# Patient Record
Sex: Female | Born: 1975 | Hispanic: No | Marital: Married | Smoking: Never smoker
Health system: Southern US, Community
[De-identification: ages and names within clinical notes are randomized; demographics above are authoritative.]

## PROBLEM LIST (undated history)

## (undated) DIAGNOSIS — Z789 Other specified health status: Secondary | ICD-10-CM

## (undated) HISTORY — DX: Other specified health status: Z78.9

## (undated) HISTORY — PX: NO PAST SURGERIES: SHX2092

---

## 2008-12-07 ENCOUNTER — Ambulatory Visit (HOSPITAL_COMMUNITY): Admission: RE | Admit: 2008-12-07 | Discharge: 2008-12-07 | Payer: Self-pay | Admitting: Obstetrics & Gynecology

## 2009-01-08 ENCOUNTER — Encounter: Admission: RE | Admit: 2009-01-08 | Discharge: 2009-01-08 | Payer: Self-pay | Admitting: Pulmonary Disease

## 2009-04-19 ENCOUNTER — Ambulatory Visit: Payer: Self-pay | Admitting: Obstetrics and Gynecology

## 2009-04-20 ENCOUNTER — Ambulatory Visit: Payer: Self-pay | Admitting: Family

## 2009-04-20 ENCOUNTER — Inpatient Hospital Stay (HOSPITAL_COMMUNITY): Admission: AD | Admit: 2009-04-20 | Discharge: 2009-04-22 | Payer: Self-pay | Admitting: Obstetrics & Gynecology

## 2010-05-27 ENCOUNTER — Emergency Department (HOSPITAL_COMMUNITY)
Admission: EM | Admit: 2010-05-27 | Discharge: 2010-05-27 | Payer: Self-pay | Source: Home / Self Care | Admitting: Family Medicine

## 2010-11-27 LAB — POCT URINALYSIS DIPSTICK
Bilirubin Urine: NEGATIVE
Ketones, ur: NEGATIVE mg/dL
Specific Gravity, Urine: >=1.03 (ref 1.005–1.030)
pH: 6 (ref 5.0–8.0)

## 2010-11-27 LAB — URINE CULTURE
Colony Count: 65000
Culture  Setup Time: 201109140534

## 2010-12-20 LAB — CBC
Hemoglobin: 13.2 g/dL (ref 12.0–15.0)
MCHC: 34 g/dL (ref 30.0–36.0)
RDW: 15.6 % — ABNORMAL HIGH (ref 11.5–15.5)

## 2011-04-25 ENCOUNTER — Inpatient Hospital Stay (HOSPITAL_COMMUNITY): Payer: Medicaid Other

## 2011-04-25 ENCOUNTER — Encounter (HOSPITAL_COMMUNITY): Payer: Self-pay

## 2011-04-25 ENCOUNTER — Inpatient Hospital Stay (HOSPITAL_COMMUNITY)
Admission: AD | Admit: 2011-04-25 | Discharge: 2011-04-25 | Disposition: A | Discharge status: Home / Self Care | Payer: Medicaid Other | Source: Ambulatory Visit | Attending: Obstetrics & Gynecology | Admitting: Obstetrics & Gynecology

## 2011-04-25 DIAGNOSIS — O209 Hemorrhage in early pregnancy, unspecified: Secondary | ICD-10-CM | POA: Insufficient documentation

## 2011-04-25 HISTORY — DX: Other specified health status: Z78.9

## 2011-04-25 LAB — CBC
Hemoglobin: 11.2 g/dL — ABNORMAL LOW (ref 12.0–15.0)
MCH: 28.3 pg (ref 26.0–34.0)
RBC: 3.96 MIL/uL (ref 3.87–5.11)

## 2011-04-25 LAB — WET PREP, GENITAL
Clue Cells Wet Prep HPF POC: NONE SEEN
Trich, Wet Prep: NONE SEEN
Yeast Wet Prep HPF POC: NONE SEEN

## 2011-04-25 LAB — URINALYSIS, ROUTINE W REFLEX MICROSCOPIC
Glucose, UA: NEGATIVE mg/dL
Ketones, ur: 15 mg/dL — AB
Leukocytes, UA: NEGATIVE
Protein, ur: NEGATIVE mg/dL

## 2011-04-25 LAB — URINE MICROSCOPIC-ADD ON

## 2011-04-25 LAB — ANTIBODY SCREEN: Antibody Screen: NEGATIVE

## 2011-04-25 LAB — RPR: RPR: NONREACTIVE

## 2011-04-25 LAB — ABO/RH: ABO/RH(D): B POS

## 2011-04-25 NOTE — ED Provider Notes (Signed)
History     CSN: [id number] Arrival date & time: 04/25/2011  4:42 PM  Chief Complaint  Patient presents with  . Vaginal Bleeding  . Abdominal Pain   HPI Jacqueline [name] is a 35 y.o. female @ [redacted] weeks gestation who presents to [facility]U with vaginal bleeding. The bleeding started approximately three hours prior to arrival to MAU and now is lighter and brown in color. The patient and her husband  provided the history.   Past Medical History  Diagnosis Date  . No pertinent past medical history     Past Surgical History  Procedure Date  . No past surgeries     No family history on file.  History  Substance Use Topics  . Smoking status: Never Smoker   . Smokeless tobacco: Never Used  . Alcohol Use: No    OB History    Grav Para Term Preterm Abortions TAB SAB Ect Mult Living   3 2 2       2       Review of Systems  Constitutional: Negative for fever and chills.  HENT: Negative.   Eyes: Negative.   Gastrointestinal: Positive for abdominal pain.  Genitourinary: Positive for vaginal bleeding and vaginal discharge. Negative for dysuria.  Neurological: Negative for dizziness and headaches.    Physical Exam  BP 114/70  Pulse 78  Temp(Src) 98.3 F (36.8 C) (Oral)  Resp 16  Ht 5' 5.75" (1.67 m)  Wt 143 lb (64.864 kg)  BMI 23.26 kg/m2  LMP 01/28/2011  Physical Exam  Nursing note and vitals reviewed. Constitutional: She is oriented to person, place, and time. She appears well-developed and well-nourished.  Eyes: EOM are normal.  Neck: Neck supple.  Pulmonary/Chest: Effort normal.  Abdominal: Soft. There is no tenderness.  Genitourinary:       There is blood tinged mucous discharge in the vaginal vault. Cervix is closed and no CMT. Uterus is approximately 14 week size.  Musculoskeletal: Normal range of motion.  Neurological: She is alert and oriented to person, place, and time. No cranial nerve deficit.  Skin: Skin is warm and dry.    ED Course  Procedures  US OB Comp  Less 14 Wks   Status: Final result     Study Result     *RADIOLOGY REPORT*   Clinical Data: 35 year old female with bleeding, no prenatal care. Gestational age by LMP 12 weeks and 3 days.   OBSTETRIC <14 WK ULTRASOUND   Technique:  Transabdominal ultrasound was performed for evaluation of the gestation as well as the maternal uterus and adnexal regions.   Comparison:  None relevant.   Intrauterine gestational sac: Single Yolk sac: Not visible Embryo: Visible Cardiac Activity: Detected Heart Rate: 163 bpm   CRL:  67.8 mm  13w  1d        [place] EDC: 10/30/2011   Maternal uterus/Adnexae: No pelvic free fluid.  The left ovary appears within normal limits measuring 3.4 x 1.1 x 1.6 cm.  The right ovary could not be visualized despite repeated imaging attempts.  No subchorionic hemorrhage.  Suggestion of mildly increased amniotic fluid.   IMPRESSION: 1. Viable singleton intrauterine pregnancy with estimated gestational age of [redacted] weeks and 1 days by crown-rump length. 2. Suggestion of increased amniotic fluid.  Recommend attention on follow-up. 2.  Nonvisualization of the right adnexa.   Original Report Authenticated By: Harley Hallmark, M.D.      Imaging Information Signed by       Signed Date/Time  Phone Pager    Elvera Maria 04/25/2011  7:47 PM EDT [phone]     MDM Results for orders placed during the hospital encounter of 04/25/11 (from the past 24 hour(s))  URINALYSIS, ROUTINE W REFLEX MICROSCOPIC     Status: Abnormal   Collection Time   04/25/11  4:55 PM      Component Value Range   Color, Urine YELLOW  YELLOW    Appearance CLEAR  CLEAR    Specific Gravity, Urine 1.025  1.005 - 1.030    pH 5.5  5.0 - 8.0    Glucose, UA NEGATIVE  NEGATIVE (mg/dL)   Hgb urine dipstick MODERATE (*) NEGATIVE    Bilirubin Urine NEGATIVE  NEGATIVE    Ketones, ur 15 (*) NEGATIVE (mg/dL)   Protein, ur NEGATIVE  NEGATIVE (mg/dL)   Urobilinogen, UA 0.2  0.0 - 1.0 (mg/dL)     Nitrite NEGATIVE  NEGATIVE    Leukocytes, UA NEGATIVE  NEGATIVE   URINE MICROSCOPIC-ADD ON     Status: Abnormal   Collection Time   04/25/11  4:55 PM      Component Value Range   Squamous Epithelial / LPF FEW (*) RARE    WBC, UA 3-6  <3 (WBC/hpf)   RBC / HPF 0-2  <3 (RBC/hpf)   Bacteria, UA FEW (*) RARE   POCT PREGNANCY, URINE     Status: Normal   Collection Time   04/25/11  5:26 PM      Component Value Range   Preg Test, Ur POSITIVE    WET PREP, GENITAL     Status: Abnormal   Collection Time   04/25/11  6:00 PM      Component Value Range   Yeast, Wet Prep NONE SEEN  NONE SEEN    Trich, Wet Prep NONE SEEN  NONE SEEN    Clue Cells, Wet Prep NONE SEEN  NONE SEEN    WBC, Wet Prep HPF POC MODERATE (*) NONE SEEN   CBC     Status: Abnormal   Collection Time   04/25/11  6:05 PM      Component Value Range   WBC 7.6  4.0 - 10.5 (K/uL)   RBC 3.96  3.87 - 5.11 (MIL/uL)   Hemoglobin 11.2 (*) 12.0 - 15.0 (g/dL)   HCT 62.9 (*) 52.8 - 46.0 (%)   MCV 82.8  78.0 - 100.0 (fL)   MCH 28.3  26.0 - 34.0 (pg)   MCHC 34.1  30.0 - 36.0 (g/dL)   RDW 41.3  24.4 - 01.0 (%)   Platelets 230  150 - 400 (K/uL)  ABO/RH     Status: Normal   Collection Time   04/25/11  6:05 PM      Component Value Range   ABO/RH(D) B POS     Assessment: Viable IUP at 13.[redacted] weeks gestation                       Bleeding in second trimester pregnancy  Plan:  Start prenatal care           Return here as needed.           Pregnancy verification letter       Kerrie Buffalo, NP 04/25/11 1955

## 2011-04-25 NOTE — Progress Notes (Signed)
Onset of brown mucus discharge with a little bit of cramping which started about 3 hours ago, 12 weeks

## 2011-04-29 NOTE — ED Provider Notes (Signed)
Agree with above note.  Jacqueline [name] 04/29/2011 9:16 AM   

## 2011-06-02 LAB — RUBELLA ANTIBODY, IGM: Rubella: IMMUNE

## 2011-06-03 ENCOUNTER — Other Ambulatory Visit: Payer: Self-pay | Admitting: Obstetrics & Gynecology

## 2011-06-03 DIAGNOSIS — O09529 Supervision of elderly multigravida, unspecified trimester: Secondary | ICD-10-CM

## 2011-06-11 ENCOUNTER — Other Ambulatory Visit: Payer: Self-pay | Admitting: Obstetrics

## 2011-06-11 DIAGNOSIS — O09529 Supervision of elderly multigravida, unspecified trimester: Secondary | ICD-10-CM

## 2011-06-12 ENCOUNTER — Ambulatory Visit (HOSPITAL_COMMUNITY): Payer: Medicaid Other

## 2011-06-12 ENCOUNTER — Ambulatory Visit (HOSPITAL_COMMUNITY)
Admission: RE | Admit: 2011-06-12 | Discharge: 2011-06-12 | Disposition: A | Discharge status: Home / Self Care | Payer: Medicaid Other | Source: Ambulatory Visit | Attending: Obstetrics | Admitting: Obstetrics

## 2011-06-12 DIAGNOSIS — O09529 Supervision of elderly multigravida, unspecified trimester: Secondary | ICD-10-CM

## 2011-06-12 DIAGNOSIS — O358XX0 Maternal care for other (suspected) fetal abnormality and damage, not applicable or unspecified: Secondary | ICD-10-CM | POA: Insufficient documentation

## 2011-06-12 DIAGNOSIS — Z1389 Encounter for screening for other disorder: Secondary | ICD-10-CM | POA: Insufficient documentation

## 2011-06-12 DIAGNOSIS — Z363 Encounter for antenatal screening for malformations: Secondary | ICD-10-CM | POA: Insufficient documentation

## 2011-06-16 DIAGNOSIS — O09529 Supervision of elderly multigravida, unspecified trimester: Secondary | ICD-10-CM | POA: Insufficient documentation

## 2011-06-16 DIAGNOSIS — Z843 Family history of consanguinity: Secondary | ICD-10-CM | POA: Insufficient documentation

## 2011-06-16 NOTE — Progress Notes (Signed)
Genetic Counseling  High-Risk Gestation Note  Appointment Date:  06/12/2011 Referred By: Brock Bad, MD Date of Birth:  12/31/1975 Attending: Particia Nearing, MD    Pregnancy History: [id number] Estimated Date of Delivery: 11/04/11 Estimated Gestational Age: [redacted]w[redacted]d  Jacqueline [name] was seen for prenatal genetic counseling given advanced maternal age. The patient was accompanied by a friend as well as her two children.   They were counseled regarding maternal age and the association with risk for chromosome conditions due to nondisjunction with aging of the ova.   We reviewed chromosomes, nondisjunction, and the associated 1 in 35 risk for fetal aneuploidy related to a maternal age of 62 at 55 weeks and 2 days gestation. They were counseled that the risk for aneuploidy decreases as gestational age increases, accounting for those pregnancies which spontaneously abort.  We specifically discussed Down syndrome (trisomy 47), trisomies 60 and 67, and sex chromosome aneuploidies (47,XXX and 47,XXY) including the common features and prognoses of each.    We reviewed available screening and diagnostic options.  Regarding screening tests, we discussed the options of Quad screen and ultrasound.  They understand that screening tests are used to modify a patient's a priori risk for aneuploidy, typically based on age.  This estimate provides a pregnancy specific risk assessment.  We also reviewed the availability of diagnostic option of amniocentesis.  A risk of 1 in 200-300 was given for amniocentesis, the primary complication being spontaneous pregnancy loss. We discussed the risks, limitations, and benefits of each.  They were counseled that 50-80% of fetuses with Down syndrome and up to 90% of fetuses with trisomies 13 and 18, when well visualized, have detectable anomalies or soft markers by ultrasound.  We reviewed the results of today's ultrasound, which confirmed the pregnancy to be 19 weeks and 2 days  gestation. A choroid plexus cyst was visualized at the time of today's visit. The choroid plexus is an area in the brain where cerebral spinal fluid, the fluid that bathes the brain and spinal cord, is made.  Cysts, or fluid filled sacs, are sometimes found in the choroid plexus of babies both before and after they are born.  Approximately 1% of pregnancies evaluated by ultrasound will show these cysts.  The significance of these cysts remains unclear, although it is believed that in many cases they are a normal variation of development.  It appears that there may be a slightly increased risk of a chromosome condition associated with these cysts when they are seen with other ultrasound findings.  The presence of a CPC would increase the risk for Trisomy 18. Thus, the risk for T18 in the current pregnancy would be increased above the patient's age related risk to approximately 1 in 25. After reviewing the available information and options, Jacqueline [name] elected to have ultrasound only and declined Quad screening and amniocentesis.  She expressed that she is not interested in pursuing diagnostic testing at this time or in the future, given the associated risk of complications.  She understands that ultrasound cannot rule out all birth defects or genetic syndromes.   Both family histories were reviewed and found to be contributory for consanguinity.  The patient reported that she and her husband and second cousins. Her paternal grandfather and his maternal grandmother are siblings. We discussed that children born to a consanguineous couple are at increased risk for genetic health problems.  This increase in risk is related to the possibility of passing on recessive genes. We explained that every  person carries approximately 7-10 non-working genes that when received in a double dose results in recessive genetic conditions.  In general, unrelated couples have a relatively low risk of having a child with a recessive  condition because the likelihood of both parents carrying the same non-working recessive gene is very low.  However, when a couple is related, they have inherited some of their genetic information from the same family member, which leads to an increased chance that they may carry the same recessive gene and have a child with a recessive condition.  For second cousin (fifth degree relatives) unions, the risk to have a child with a birth defect, mental retardation, or genetic condition is increased approximately 1% above the general population risk (3-5%).  We reviewed chromosomes, genes, and recessive inheritance in detail.   Variants in hemoglobin and thalassemias are more common in individuals from [place].  We do not have records that Jacqueline [name] has had a hemoglobin electrophoresis. This screen is available to her if desired. Additionally, these conditions are routinely test for as part of the newborn screening panel for babies born in [place] in a hospital. Without further information regarding the provided family history, an accurate genetic risk cannot be calculated.   Further genetic counseling is warranted if more information is obtained.   The patient denied exposure to environmental toxins or chemical agents.  She denied the use of alcohol, tobacco or street drugs.  She denied significant viral illnesses during the course of her pregnancy.  Her medical and surgical history were noncontributory.    A complete obstetrical ultrasound was performed at the time of today's evaluation.  The ultrasound report is reported separately.     We counseled the patient for approximately 30 minutes regarding the above risks and available options.     Clydie Braun Tomoki Lucken, MS, [facility] 06/16/2011

## 2011-09-15 NOTE — L&D Delivery Note (Signed)
Delivery Note At 4:49 AM a viable female was delivered via Vaginal, Spontaneous Delivery (Presentation:LOA ;  ).  APGAR: 9, 9; weight 8 lb 3.2 oz (3720 g).   Placenta status: Intact, Spontaneous.  Cord: 3 vessels with the following complications: None.  Cord pH: none  Anesthesia: Epidural  Episiotomy: None Lacerations: 1st degree;Perineal Suture Repair: 2.0 chromic Est. Blood Loss (mL):   Mom to postpartum.  Baby to nursery-stable.  Jacqueline [name] A 11/02/2011, 6:00 AM

## 2011-09-15 NOTE — L&D Delivery Note (Deleted)
Delivery Note At 4:49 AM [name] viable female was delivered via Vaginal, Spontaneous Delivery (Presentation: ;  ).  APGAR: 9, 9; weight 8 lb 3.2 oz (3720 g).   Placenta status: Intact, Spontaneous.  Cord: 3 vessels with the following complications: None.  Cord pH: none  Anesthesia: Epidural  Episiotomy: None Lacerations: 1st degree;Perineal Suture Repair: 2.0 chromic and 3-0 vicryl rapide. Est. Blood Loss (mL):   Mom to postpartum.  Baby to NICU for prematurity.  Jacqueline [name] 11/02/2011, 5:57 AM

## 2011-11-02 ENCOUNTER — Encounter (HOSPITAL_COMMUNITY): Payer: Self-pay | Admitting: Anesthesiology

## 2011-11-02 ENCOUNTER — Encounter (HOSPITAL_COMMUNITY): Payer: Self-pay | Admitting: *Deleted

## 2011-11-02 ENCOUNTER — Inpatient Hospital Stay (HOSPITAL_COMMUNITY): Payer: Medicaid Other | Admitting: Anesthesiology

## 2011-11-02 ENCOUNTER — Inpatient Hospital Stay (HOSPITAL_COMMUNITY)
Admission: AD | Admit: 2011-11-02 | Discharge: 2011-11-03 | DRG: 775 | Disposition: A | Discharge status: Home / Self Care | Payer: Medicaid Other | Source: Ambulatory Visit | Attending: Obstetrics | Admitting: Obstetrics

## 2011-11-02 DIAGNOSIS — O99892 Other specified diseases and conditions complicating childbirth: Secondary | ICD-10-CM | POA: Diagnosis present

## 2011-11-02 DIAGNOSIS — Z843 Family history of consanguinity: Secondary | ICD-10-CM

## 2011-11-02 DIAGNOSIS — O09529 Supervision of elderly multigravida, unspecified trimester: Secondary | ICD-10-CM | POA: Diagnosis present

## 2011-11-02 DIAGNOSIS — Z22330 Carrier of Group B streptococcus: Secondary | ICD-10-CM

## 2011-11-02 LAB — CBC
HCT: 35.8 % — ABNORMAL LOW (ref 36.0–46.0)
Hemoglobin: 11.8 g/dL — ABNORMAL LOW (ref 12.0–15.0)
MCH: 27.5 pg (ref 26.0–34.0)
MCHC: 33 g/dL (ref 30.0–36.0)
MCV: 83.4 fL (ref 78.0–100.0)
RDW: 14 % (ref 11.5–15.5)

## 2011-11-02 MED ORDER — OXYTOCIN 10 UNIT/ML IJ SOLN
40.0000 [IU] | Freq: Once | INTRAVENOUS | Status: AC
  Administered 2011-11-02: 40 [IU] via INTRAVENOUS
  Filled 2011-11-02: qty 4

## 2011-11-02 MED ORDER — FENTANYL 2.5 MCG/ML BUPIVACAINE 1/10 % EPIDURAL INFUSION (WH - ANES)
14.0000 mL/h | INTRAMUSCULAR | Status: DC
  Administered 2011-11-02: 14 mL/h via EPIDURAL
  Filled 2011-11-02: qty 60

## 2011-11-02 MED ORDER — LACTATED RINGERS IV SOLN: 500.0000 mL | Freq: Once | INTRAVENOUS | Status: DC

## 2011-11-02 MED ORDER — ZOLPIDEM TARTRATE 5 MG PO TABS
5.0000 mg | ORAL_TABLET | Freq: Every evening | ORAL | Status: DC | PRN
Start: 2011-11-02 — End: 2011-11-02

## 2011-11-02 MED ORDER — PHENYLEPHRINE 40 MCG/ML (10ML) SYRINGE FOR IV PUSH (FOR BLOOD PRESSURE SUPPORT)
80.0000 ug | PREFILLED_SYRINGE | INTRAVENOUS | Status: DC | PRN
  Filled 2011-11-02: qty 5
  Filled 2011-11-02: qty 2

## 2011-11-02 MED ORDER — ZOLPIDEM TARTRATE 5 MG PO TABS: 5.0000 mg | ORAL_TABLET | Freq: Every evening | ORAL | Status: DC | PRN

## 2011-11-02 MED ORDER — TETANUS-DIPHTH-ACELL PERTUSSIS 5-2.5-18.5 LF-MCG/0.5 IM SUSP
0.5000 mL | Freq: Once | INTRAMUSCULAR | Status: AC
  Administered 2011-11-03: 0.5 mL via INTRAMUSCULAR
  Filled 2011-11-02: qty 0.5

## 2011-11-02 MED ORDER — IBUPROFEN 600 MG PO TABS
600.0000 mg | ORAL_TABLET | Freq: Four times a day (QID) | ORAL | Status: DC
  Administered 2011-11-02 – 2011-11-03 (×6): 600 mg via ORAL
  Filled 2011-11-02 (×5): qty 1

## 2011-11-02 MED ORDER — SIMETHICONE 80 MG PO CHEW: 80.0000 mg | CHEWABLE_TABLET | ORAL | Status: DC | PRN

## 2011-11-02 MED ORDER — OXYTOCIN 20 UNITS IN LACTATED RINGERS INFUSION - SIMPLE
125.0000 mL/h | INTRAVENOUS | Status: DC | PRN
  Administered 2011-11-02: 125 mL/h via INTRAVENOUS
  Filled 2011-11-02: qty 1000

## 2011-11-02 MED ORDER — LIDOCAINE HCL (PF) 1 % IJ SOLN
30.0000 mL | INTRAMUSCULAR | Status: DC | PRN
  Filled 2011-11-02 (×2): qty 30

## 2011-11-02 MED ORDER — FLEET ENEMA 7-19 GM/118ML RE ENEM: 1.0000 | ENEMA | RECTAL | Status: DC | PRN

## 2011-11-02 MED ORDER — ONDANSETRON HCL 4 MG/2ML IJ SOLN: 4.0000 mg | INTRAMUSCULAR | Status: DC | PRN

## 2011-11-02 MED ORDER — OXYTOCIN 20 UNITS IN LACTATED RINGERS INFUSION - SIMPLE: 125.0000 mL/h | INTRAVENOUS | Status: DC | PRN

## 2011-11-02 MED ORDER — METHYLERGONOVINE MALEATE 0.2 MG/ML IJ SOLN: 0.2000 mg | INTRAMUSCULAR | Status: DC | PRN

## 2011-11-02 MED ORDER — LANOLIN HYDROUS EX OINT: TOPICAL_OINTMENT | CUTANEOUS | Status: DC | PRN

## 2011-11-02 MED ORDER — DIBUCAINE 1 % RE OINT: 1.0000 "application " | TOPICAL_OINTMENT | RECTAL | Status: DC | PRN

## 2011-11-02 MED ORDER — CITRIC ACID-SODIUM CITRATE 334-500 MG/5ML PO SOLN: 30.0000 mL | ORAL | Status: DC | PRN

## 2011-11-02 MED ORDER — OXYCODONE-ACETAMINOPHEN 5-325 MG PO TABS: 1.0000 | ORAL_TABLET | ORAL | Status: DC | PRN

## 2011-11-02 MED ORDER — DIPHENHYDRAMINE HCL 25 MG PO CAPS: 25.0000 mg | ORAL_CAPSULE | Freq: Four times a day (QID) | ORAL | Status: DC | PRN

## 2011-11-02 MED ORDER — DIPHENHYDRAMINE HCL 50 MG/ML IJ SOLN: 12.5000 mg | INTRAMUSCULAR | Status: DC | PRN

## 2011-11-02 MED ORDER — ONDANSETRON HCL 4 MG PO TABS: 4.0000 mg | ORAL_TABLET | ORAL | Status: DC | PRN

## 2011-11-02 MED ORDER — OXYTOCIN 20 UNITS IN LACTATED RINGERS INFUSION - SIMPLE: 40.0000 m[IU]/min | INTRAVENOUS | Status: DC

## 2011-11-02 MED ORDER — PHENYLEPHRINE 40 MCG/ML (10ML) SYRINGE FOR IV PUSH (FOR BLOOD PRESSURE SUPPORT)
80.0000 ug | PREFILLED_SYRINGE | INTRAVENOUS | Status: DC | PRN
  Filled 2011-11-02: qty 2

## 2011-11-02 MED ORDER — BENZOCAINE-MENTHOL 20-0.5 % EX AERO: 1.0000 "application " | INHALATION_SPRAY | CUTANEOUS | Status: DC | PRN

## 2011-11-02 MED ORDER — LACTATED RINGERS IV SOLN: 500.0000 mL | INTRAVENOUS | Status: DC | PRN

## 2011-11-02 MED ORDER — METHYLERGONOVINE MALEATE 0.2 MG PO TABS: 0.2000 mg | ORAL_TABLET | ORAL | Status: DC | PRN

## 2011-11-02 MED ORDER — ACETAMINOPHEN 325 MG PO TABS: 650.0000 mg | ORAL_TABLET | ORAL | Status: DC | PRN

## 2011-11-02 MED ORDER — WITCH HAZEL-GLYCERIN EX PADS: 1.0000 "application " | MEDICATED_PAD | CUTANEOUS | Status: DC | PRN

## 2011-11-02 MED ORDER — OXYTOCIN 20 UNITS IN LACTATED RINGERS INFUSION - SIMPLE: 125.0000 mL/h | Freq: Once | INTRAVENOUS | Status: DC

## 2011-11-02 MED ORDER — IBUPROFEN 600 MG PO TABS: 600.0000 mg | ORAL_TABLET | Freq: Four times a day (QID) | ORAL | Status: DC

## 2011-11-02 MED ORDER — BENZOCAINE-MENTHOL 20-0.5 % EX AERO
1.0000 "application " | INHALATION_SPRAY | CUTANEOUS | Status: DC | PRN
  Administered 2011-11-03: 1 via TOPICAL

## 2011-11-02 MED ORDER — SENNOSIDES-DOCUSATE SODIUM 8.6-50 MG PO TABS
2.0000 | ORAL_TABLET | Freq: Every day | ORAL | Status: DC
  Administered 2011-11-02: 2 via ORAL

## 2011-11-02 MED ORDER — LACTATED RINGERS IV SOLN
INTRAVENOUS | Status: DC
  Administered 2011-11-02: 03:00:00 via INTRAVENOUS

## 2011-11-02 MED ORDER — TETANUS-DIPHTH-ACELL PERTUSSIS 5-2.5-18.5 LF-MCG/0.5 IM SUSP: 0.5000 mL | Freq: Once | INTRAMUSCULAR | Status: DC

## 2011-11-02 MED ORDER — PRENATAL MULTIVITAMIN CH
1.0000 | ORAL_TABLET | Freq: Every day | ORAL | Status: DC
  Administered 2011-11-02 – 2011-11-03 (×2): 1 via ORAL
  Filled 2011-11-02 (×2): qty 1

## 2011-11-02 MED ORDER — IBUPROFEN 600 MG PO TABS
600.0000 mg | ORAL_TABLET | Freq: Four times a day (QID) | ORAL | Status: DC | PRN
  Filled 2011-11-02: qty 1

## 2011-11-02 MED ORDER — OXYTOCIN BOLUS FROM INFUSION
500.0000 mL | Freq: Once | INTRAVENOUS | Status: DC
  Filled 2011-11-02: qty 1000
  Filled 2011-11-02: qty 500

## 2011-11-02 MED ORDER — PRENATAL MULTIVITAMIN CH: 1.0000 | ORAL_TABLET | Freq: Every day | ORAL | Status: DC

## 2011-11-02 MED ORDER — ONDANSETRON HCL 4 MG/2ML IJ SOLN: 4.0000 mg | Freq: Four times a day (QID) | INTRAMUSCULAR | Status: DC | PRN

## 2011-11-02 MED ORDER — SENNOSIDES-DOCUSATE SODIUM 8.6-50 MG PO TABS: 2.0000 | ORAL_TABLET | Freq: Every day | ORAL | Status: DC

## 2011-11-02 MED ORDER — LIDOCAINE HCL (PF) 1 % IJ SOLN
INTRAMUSCULAR | Status: DC | PRN
  Administered 2011-11-02 (×2): 5 mL

## 2011-11-02 MED ORDER — EPHEDRINE 5 MG/ML INJ
10.0000 mg | INTRAVENOUS | Status: DC | PRN
  Filled 2011-11-02: qty 4
  Filled 2011-11-02: qty 2

## 2011-11-02 MED ORDER — SODIUM CHLORIDE 0.9 % IV SOLN
2.0000 g | Freq: Four times a day (QID) | INTRAVENOUS | Status: DC
  Administered 2011-11-02: 2 g via INTRAVENOUS
  Filled 2011-11-02 (×4): qty 2000

## 2011-11-02 MED ORDER — EPHEDRINE 5 MG/ML INJ
10.0000 mg | INTRAVENOUS | Status: DC | PRN
  Filled 2011-11-02: qty 2

## 2011-11-02 NOTE — Anesthesia Procedure Notes (Signed)
Epidural Patient location during procedure: OB Start time: 11/02/2011 3:31 AM  Staffing Anesthesiologist: Brayton Caves R Performed by: anesthesiologist   Preanesthetic Checklist Completed: patient identified, site marked, surgical consent, pre-op evaluation, timeout performed, IV checked, risks and benefits discussed and monitors and equipment checked  Epidural Patient position: sitting Prep: site prepped and draped and DuraPrep Patient monitoring: continuous pulse ox and blood pressure Approach: midline Injection technique: LOR air and LOR saline  Needle:  Needle type: Tuohy  Needle gauge: 17 G Needle length: 9 cm Needle insertion depth: 5 cm cm Catheter type: closed end flexible Catheter size: 19 Gauge Catheter at skin depth: 10 cm Test dose: negative  Assessment Events: blood not aspirated, injection not painful, no injection resistance, negative IV test and no paresthesia  Additional Notes Patient identified.  Risk benefits discussed including failed block, incomplete pain control, headache, nerve damage, paralysis, blood pressure changes, nausea, vomiting, reactions to medication both toxic or allergic, and postpartum back pain.  Patient expressed understanding and wished to proceed.  All questions were answered.  Sterile technique used throughout procedure and epidural site dressed with sterile barrier dressing. No paresthesia or other complications noted.The patient did not experience any signs of intravascular injection such as tinnitus or metallic taste in mouth nor signs of intrathecal spread such as rapid motor block. Please see nursing notes for vital signs.

## 2011-11-02 NOTE — Anesthesia Preprocedure Evaluation (Signed)

## 2011-11-02 NOTE — Anesthesia Postprocedure Evaluation (Signed)
  Anesthesia Post-op Note  Patient: Jacqueline [name]  Procedure(s) Performed: * No procedures listed *  Patient Location: PACU and Mother/Baby  Anesthesia Type: Epidural  Level of Consciousness: awake, alert , oriented and patient cooperative  Airway and Oxygen Therapy: Patient Spontanous Breathing  Post-op Pain: none  Post-op Assessment: Post-op Vital signs reviewed  Post-op Vital Signs: Reviewed and stable  Complications: No apparent anesthesia complications 

## 2011-11-02 NOTE — H&P (Signed)
Jacqueline [name] is a 36 y.o. female presenting for UC's. Maternal Medical History:  Reason for admission: Reason for admission: contractions.  Contractions: Onset was 3-5 hours ago.   Frequency: regular.   Duration is approximately 1 minute.    Fetal activity: Perceived fetal activity is normal.   Last perceived fetal movement was within the past hour.    Prenatal complications: no prenatal complications Prenatal Complications - Diabetes: none.    OB History    Grav Para Term Preterm Abortions TAB SAB Ect Mult Living   3 3 3       3      Past Medical History  Diagnosis Date  . No pertinent past medical history    Past Surgical History  Procedure Date  . No past surgeries    Family History: family history is not on file. Social History:  reports that she has never smoked. She has never used smokeless tobacco. She reports that she does not drink alcohol or use illicit drugs.  Review of Systems  All other systems reviewed and are negative.    Dilation: 10 Effacement (%): 100 Station: +2 Exam by:: AEarlene Plater, RN Blood pressure 108/68, pulse 74, temperature 97.9 F (36.6 C), temperature source Oral, resp. rate 20, last menstrual period 01/28/2011, SpO2 100.00%, unknown if currently breastfeeding. Maternal Exam:  Uterine Assessment: Contraction strength is firm.  Contraction duration is 1 minute. Abdomen: Patient reports no abdominal tenderness. Fetal presentation: vertex  Introitus: Normal vulva. Normal vagina.    Physical Exam  Nursing note and vitals reviewed. Constitutional: She appears well-developed and well-nourished.  Neck: Normal range of motion.  Cardiovascular: Normal rate and regular rhythm.   Respiratory: Effort normal.  GI: Soft.  Genitourinary: Vagina normal and uterus normal.  Musculoskeletal: Normal range of motion.  Skin: Skin is warm and dry.    Prenatal labs: ABO, Rh: --/--/B POS (08/11 1805) Antibody: Negative (08/11 0000) Rubella: Immune (09/18  0000) RPR: Nonreactive (08/11 0000)  HBsAg: Negative (08/11 0000)  HIV: Non-reactive (08/11 0000)  GBS: Positive (01/03 0000)   Assessment/Plan: Term pregnancy.  Active labor.   Abdoul Encinas A 11/02/2011, 5:52 AM

## 2011-11-02 NOTE — Progress Notes (Signed)
UR chart review completed.  

## 2011-11-02 NOTE — Anesthesia Postprocedure Evaluation (Signed)
  Anesthesia Post-op Note  Patient: Jacqueline [name]  Procedure(s) Performed: * No procedures listed *  Patient Location: PACU and Mother/Baby  Anesthesia Type: Epidural  Level of Consciousness: awake, alert , oriented and patient cooperative  Airway and Oxygen Therapy: Patient Spontanous Breathing  Post-op Pain: none  Post-op Assessment: Post-op Vital signs reviewed  Post-op Vital Signs: Reviewed and stable  Complications: No apparent anesthesia complications

## 2011-11-03 LAB — CBC
Hemoglobin: 10.7 g/dL — ABNORMAL LOW (ref 12.0–15.0)
MCH: 27.9 pg (ref 26.0–34.0)
MCHC: 32.8 g/dL (ref 30.0–36.0)
Platelets: 166 10*3/uL (ref 150–400)
RDW: 14.2 % (ref 11.5–15.5)

## 2011-11-03 MED ORDER — IBUPROFEN 600 MG PO TABS: 600.0000 mg | ORAL_TABLET | Freq: Four times a day (QID) | ORAL | Status: DC

## 2011-11-03 MED ORDER — OXYCODONE-ACETAMINOPHEN 5-325 MG PO TABS: 1.0000 | ORAL_TABLET | ORAL | Status: DC | PRN

## 2011-11-03 MED ORDER — BENZOCAINE-MENTHOL 20-0.5 % EX AERO
INHALATION_SPRAY | CUTANEOUS | Status: AC
  Filled 2011-11-03: qty 56

## 2011-11-03 NOTE — Progress Notes (Signed)
Post Partum Day 1 Subjective: no complaints  Objective: Blood pressure 103/67, pulse 82, temperature 98.3 F (36.8 C), temperature source Oral, resp. rate 18, last menstrual period 01/28/2011, SpO2 98.00%, unknown if currently breastfeeding.  Physical Exam:  General: alert and no distress Lochia: appropriate Uterine Fundus: firm Incision: healing well DVT Evaluation: No evidence of DVT seen on physical exam.   Basename 11/03/11 0540 11/02/11 0300  HGB 10.7* 11.8*  HCT 32.6* 35.8*    Assessment/Plan: Discharge home.   LOS: 1 day   Jaeson Molstad A 11/03/2011, 9:26 AM

## 2011-11-03 NOTE — Discharge Summary (Signed)
Obstetric Discharge Summary Reason for Admission: onset of labor Prenatal Procedures: ultrasound Intrapartum Procedures: spontaneous vaginal delivery Postpartum Procedures: none Complications-Operative and Postpartum: none Hemoglobin  Date Value Range Status  11/03/2011 10.7* 12.0-15.0 (g/dL) Final     HCT  Date Value Range Status  11/03/2011 32.6* 36.0-46.0 (%) Final    Discharge Diagnoses: Term Pregnancy-delivered  Discharge Information: Date: 11/03/2011 Activity: pelvic rest Diet: routine Medications: PNV, Ibuprofen and Percocet Condition: stable Instructions: refer to practice specific booklet Discharge to: home Follow-up Information    Schedule an appointment as soon as possible for a visit with Brock Bad, MD.   Contact information:   [address] [place] [address] [phone]          Newborn Data: Live born female  Birth Weight: 8 lb 3.2 oz (3720 g) APGAR: 9, 9  Home with mother.  Burnice Vassel A 11/03/2011, 9:48 AM

## 2013-08-31 ENCOUNTER — Ambulatory Visit (INDEPENDENT_AMBULATORY_CARE_PROVIDER_SITE_OTHER): Payer: Medicaid Other | Admitting: Obstetrics & Gynecology

## 2013-08-31 ENCOUNTER — Encounter: Payer: Self-pay | Admitting: Obstetrics & Gynecology

## 2013-08-31 VITALS — BP 108/69 | Temp 98.2°F | Ht 63.5 in | Wt 159.0 lb

## 2013-08-31 DIAGNOSIS — Z3201 Encounter for pregnancy test, result positive: Secondary | ICD-10-CM

## 2013-08-31 DIAGNOSIS — Z3482 Encounter for supervision of other normal pregnancy, second trimester: Secondary | ICD-10-CM

## 2013-08-31 DIAGNOSIS — O09521 Supervision of elderly multigravida, first trimester: Secondary | ICD-10-CM

## 2013-08-31 DIAGNOSIS — O09529 Supervision of elderly multigravida, unspecified trimester: Secondary | ICD-10-CM

## 2013-08-31 DIAGNOSIS — Z3480 Encounter for supervision of other normal pregnancy, unspecified trimester: Secondary | ICD-10-CM

## 2013-08-31 LAB — POCT URINALYSIS DIPSTICK
Spec Grav, UA: >=1.03
Urobilinogen, UA: NEGATIVE

## 2013-08-31 LAB — HIV ANTIBODY (ROUTINE TESTING W REFLEX): HIV: NONREACTIVE

## 2013-08-31 NOTE — Patient Instructions (Signed)
Amniocentesis Amniocentesis (amnio) is the removal of a small amount of fluid that surrounds the baby in the amniotic sac. Once the fluid is removed, it may be examined to find answers to a number of serious questions. An amnio is often done early in pregnancy (between 15 and 17 weeks) to determine if there is a complication with the baby, or it is done later in pregnancy (between 39 and 37 weeks) to see if the baby's lungs are mature. Amnios that are done later in pregnancy are often done to help weigh the risks and benefits of a needed early delivery. Amnios done early in the second trimester are commonly referred to as genetic amnios, as they are typically done to check for a potential life-altering genetic abnormality. Rarely, an amnio is done to evaluate the amniotic fluid for concerns of infection. Amniocentesis may be done for other reasons, including:   If the mother is 39 years old or older. This is because of the increased risk of chromosome abnormalities, such as Down's syndrome or various chromosomal trisomies.  To determine any genetic problems.  To look for signs of infection in the uterus.  To determine if the baby's lungs are mature enough for the baby to survive outside of the uterus. This information is important in pregnancies when the baby must be delivered early. LET YOUR CAREGIVER KNOW ABOUT:  Any complications you have had with the pregnancy, such as bleeding or contractions.  Allergies.  Medicines taken, including vitamins, herbs, eyedrops, over-the-counter medicines, and creams.  Use of steroids (by mouth or creams).  Previous problems with numbing medicines.  History of bleeding problems or blood clots.  Previous surgery.  Other health problems. RISKS AND COMPLICATIONS  Complications can include:  Vaginal bleeding.  Transmission of an infection from mother to baby, such as hepatitis C or other viruses.  Leaking of amniotic fluid.  Premature  labor.  Fetal injury.  Injury to the placenta.  Miscarriage (rare). This procedure is done only if your caregiver feels the information obtained from the procedure justifies the risk. Amnios should not be performed before 15 weeks of pregnancy unless it is absolutely necessary. BEFORE THE PROCEDURE   Ask your caregiver any questions you may have.  Eat as usual.  Drink enough fluids to keep your urine clear or pale yellow.  You may want to arrange for someone to drive you home after the procedure. PROCEDURE  A careful ultrasound is done to evaluate the baby for its position and for where the best pockets of fluid lie. The mother's abdomen is prepped with a solution to prevent infections. Often, a sterile ultrasound probe is used to watch the location of the amniocentesis needle being used. A small spot of the skin may be injected with a numbing medicine. In that location, a longer needle is introduced through the skin and down to the level of the baby. Amniotic fluid is removed into a syringe and sent to the lab for evaluation. AFTER THE PROCEDURE   Ask your caregiver if you need a RhoGam shot.  You will rest for 1 to 2 hours for observation.  Your baby will be placed on a monitor for 1 to 2 hours to see if there are any problems.  You may develop cramping and a small amount of vaginal bleeding right after the amnio.  Ask when your test results will be ready. Make sure you get your test results. Document Released: 08/28/2000 Document Revised: 11/23/2011 Document Reviewed: 07/06/2011 [employer] Patient Information  2014 [employer].  

## 2013-08-31 NOTE — Progress Notes (Signed)
Pulse- 79  Subjective:    Jacqueline [name] is being seen today for her first obstetrical visit.  This is not a planned pregnancy. She is at [redacted]w[redacted]d gestation. Her obstetrical history is significant for advanced maternal age. Relationship with FOB: spouse, living together. Patient does intend to breast feed. Pregnancy history fully reviewed.  Menstrual History: OB History   Grav Para Term Preterm Abortions TAB SAB Ect Mult Living   4 3 3       3       Last Pap: 2012 WNL Menarche age: 20  Patient's last menstrual period was 05/30/2013.    The following portions of the patient's history were reviewed and updated as appropriate: allergies, current medications, past family history, past medical history, past social history, past surgical history and problem list.  Review of Systems Pertinent items are noted in HPI.    Objective:      General Appearance:    Alert, cooperative, no distress, appears stated age  Head:    Normocephalic, without obvious abnormality, atraumatic  Eyes:    PERRL, conjunctiva/corneas clear, EOM's intact, fundi    benign, both eyes  Ears:    Normal TM's and external ear canals, both ears  Nose:   Nares normal, septum midline, mucosa normal, no drainage    or sinus tenderness  Throat:   Lips, mucosa, and tongue normal; teeth and gums normal  Neck:   Supple, symmetrical, trachea midline, no adenopathy;    thyroid:  no enlargement/tenderness/nodules; no carotid   bruit or JVD  Back:     Symmetric, no curvature, ROM normal, no CVA tenderness  Lungs:     Clear to auscultation bilaterally, respirations unlabored  Chest Wall:    No tenderness or deformity   Heart:    Regular rate and rhythm, S1 and S2 normal, no murmur, rub   or gallop  Breast Exam:    No tenderness, masses, or nipple abnormality  Abdomen:     Soft, non-tender, bowel sounds active all four quadrants,    no masses, no organomegaly  Genitalia:    Normal female without lesion, discharge or tenderness   Extremities:   Extremities normal, atraumatic, no cyanosis or edema  Pulses:   2+ and symmetric all extremities  Skin:   Skin color, texture, turgor normal, no rashes or lesions  Lymph nodes:   Cervical, supraclavicular, and axillary nodes normal  Neurologic:   CNII-XII intact, normal strength, sensation and reflexes    throughout    Assessment:    Pregnancy at [redacted]w[redacted]d weeks    Plan:    Initial labs drawn. Prenatal vitamins.  Counseling provided regarding continued use of seat belts, cessation of alcohol consumption, smoking or use of illicit drugs; infection precautions i.e., influenza/TDAP immunizations, toxoplasmosis,CMV, parvovirus, listeria and varicella; workplace safety, exercise during pregnancy; routine dental care, safe medications, sexual activity, hot tubs, saunas, pools, travel, caffeine use, fish and methlymercury, potential toxins, hair treatments, varicose veins Weight gain recommendations per IOM guidelines reviewed:  overweight/BMI 25 - 29.9--> gain 15 - 25 lbs Problem list reviewed and updated. NIPT discussed Role of ultrasound in pregnancy discussed Amniocentesis discussed Follow up in 6 weeks. 50% of 20 min visit spent on counseling and coordination of care.

## 2013-09-01 LAB — OBSTETRIC PANEL
Antibody Screen: NEGATIVE
Eosinophils Relative: 1 % (ref 0–5)
HCT: 35.2 % — ABNORMAL LOW (ref 36.0–46.0)
Lymphocytes Relative: 19 % (ref 12–46)
Lymphs Abs: 1.7 10*3/uL (ref 0.7–4.0)
MCV: 79.3 fL (ref 78.0–100.0)
Monocytes Absolute: 0.4 10*3/uL (ref 0.1–1.0)
Monocytes Relative: 4 % (ref 3–12)
RBC: 4.44 MIL/uL (ref 3.87–5.11)
Rh Type: POSITIVE
Rubella: 19.7 Index — ABNORMAL HIGH (ref <0.90)
WBC: 8.8 10*3/uL (ref 4.0–10.5)

## 2013-09-01 LAB — PAP IG, CT-NG, RFX HPV ASCU: GC Probe Amp: NEGATIVE

## 2013-09-01 LAB — VITAMIN D 25 HYDROXY (VIT D DEFICIENCY, FRACTURES): Vit D, 25-Hydroxy: 12 ng/mL — ABNORMAL LOW (ref 30–89)

## 2013-09-01 LAB — CULTURE, OB URINE: Colony Count: NO GROWTH

## 2013-09-04 LAB — HEMOGLOBINOPATHY EVALUATION: Hgb S Quant: 0 %

## 2013-09-08 ENCOUNTER — Encounter: Payer: Self-pay | Admitting: Obstetrics & Gynecology

## 2013-09-08 DIAGNOSIS — E559 Vitamin D deficiency, unspecified: Secondary | ICD-10-CM | POA: Insufficient documentation

## 2013-09-13 ENCOUNTER — Other Ambulatory Visit: Payer: Self-pay | Admitting: *Deleted

## 2013-09-13 DIAGNOSIS — E559 Vitamin D deficiency, unspecified: Secondary | ICD-10-CM

## 2013-09-13 MED ORDER — OB COMPLETE PETITE 35-5-1-200 MG PO CAPS: 1.0000 | ORAL_CAPSULE | Freq: Every day | ORAL | Status: DC

## 2013-09-14 NOTE — L&D Delivery Note (Signed)
Delivery Note At 2:53 PM a viable female was delivered via  (Presentation: OA).    Placenta status: delivered intact via Tomasa BlaseSchultz.    Cord:  with the following complications: none .    Anesthesia: Epidural  Episiotomy: none Lacerations: 1st degree Suture Repair: 3.0 vicryl rapide Est. Blood Loss (mL): 250 ml  Mom to postpartum.  Baby to Couplet care / Skin to Skin.  [name],[name] 02/28/2014, 3:09 PM

## 2013-10-12 ENCOUNTER — Ambulatory Visit (INDEPENDENT_AMBULATORY_CARE_PROVIDER_SITE_OTHER): Payer: Medicaid Other | Admitting: Obstetrics & Gynecology

## 2013-10-12 VITALS — BP 103/67 | Temp 98.4°F | Wt 159.0 lb

## 2013-10-12 DIAGNOSIS — Z3480 Encounter for supervision of other normal pregnancy, unspecified trimester: Secondary | ICD-10-CM

## 2013-10-12 NOTE — Progress Notes (Signed)
Pulse 74 Pt is doing well. No complaints today.

## 2013-10-12 NOTE — Patient Instructions (Signed)
Second Trimester of Pregnancy The second trimester is from week 13 through week 28, months 4 through 6. The second trimester is often a time when you feel your best. Your body has also adjusted to being pregnant, and you begin to feel better physically. Usually, morning sickness has lessened or quit completely, you may have more energy, and you may have an increase in appetite. The second trimester is also a time when the fetus is growing rapidly. At the end of the sixth month, the fetus is about 9 inches long and weighs about 1 pounds. You will likely begin to feel the baby move (quickening) between 18 and 20 weeks of the pregnancy. BODY CHANGES Your body goes through many changes during pregnancy. The changes vary from woman to woman.   Your weight will continue to increase. You will notice your lower abdomen bulging out.  You may begin to get stretch marks on your hips, abdomen, and breasts.  You may develop headaches that can be relieved by medicines approved by your caregiver.  You may urinate more often because the fetus is pressing on your bladder.  You may develop or continue to have heartburn as a result of your pregnancy.  You may develop constipation because certain hormones are causing the muscles that push waste through your intestines to slow down.  You may develop hemorrhoids or swollen, bulging veins (varicose veins).  You may have back pain because of the weight gain and pregnancy hormones relaxing your joints between the bones in your pelvis and as a result of a shift in weight and the muscles that support your balance.  Your breasts will continue to grow and be tender.  Your gums may bleed and may be sensitive to brushing and flossing.  Dark spots or blotches (chloasma, mask of pregnancy) may develop on your face. This will likely fade after the baby is born.  A dark line from your belly button to the pubic area (linea nigra) may appear. This will likely fade after the  baby is born. WHAT TO EXPECT AT YOUR PRENATAL VISITS During a routine prenatal visit:  You will be weighed to make sure you and the fetus are growing normally.  Your blood pressure will be taken.  Your abdomen will be measured to track your baby's growth.  The fetal heartbeat will be listened to.  Any test results from the previous visit will be discussed. Your caregiver may ask you:  How you are feeling.  If you are feeling the baby move.  If you have had any abnormal symptoms, such as leaking fluid, bleeding, severe headaches, or abdominal cramping.  If you have any questions. Other tests that may be performed during your second trimester include:  Blood tests that check for:  Low iron levels (anemia).  Gestational diabetes (between 24 and 28 weeks).  Rh antibodies.  Urine tests to check for infections, diabetes, or protein in the urine.  An ultrasound to confirm the proper growth and development of the baby.  An amniocentesis to check for possible genetic problems.  Fetal screens for spina bifida and Down syndrome. HOME CARE INSTRUCTIONS   Avoid all smoking, herbs, alcohol, and unprescribed drugs. These chemicals affect the formation and growth of the baby.  Follow your caregiver's instructions regarding medicine use. There are medicines that are either safe or unsafe to take during pregnancy.  Exercise only as directed by your caregiver. Experiencing uterine cramps is a good sign to stop exercising.  Continue to eat regular,   healthy meals.  Wear a good support bra for breast tenderness.  Do not use hot tubs, steam rooms, or saunas.  Wear your seat belt at all times when driving.  Avoid raw meat, uncooked cheese, cat litter boxes, and soil used by cats. These carry germs that can cause birth defects in the baby.  Take your prenatal vitamins.  Try taking a stool softener (if your caregiver approves) if you develop constipation. Eat more high-fiber foods,  such as fresh vegetables or fruit and whole grains. Drink plenty of fluids to keep your urine clear or pale yellow.  Take warm sitz baths to soothe any pain or discomfort caused by hemorrhoids. Use hemorrhoid cream if your caregiver approves.  If you develop varicose veins, wear support hose. Elevate your feet for 15 minutes, 3 4 times a day. Limit salt in your diet.  Avoid heavy lifting, wear low heel shoes, and practice good posture.  Rest with your legs elevated if you have leg cramps or low back pain.  Visit your dentist if you have not gone yet during your pregnancy. Use a soft toothbrush to brush your teeth and be gentle when you floss.  A sexual relationship may be continued unless your caregiver directs you otherwise.  Continue to go to all your prenatal visits as directed by your caregiver. SEEK MEDICAL CARE IF:   You have dizziness.  You have mild pelvic cramps, pelvic pressure, or nagging pain in the abdominal area.  You have persistent nausea, vomiting, or diarrhea.  You have a bad smelling vaginal discharge.  You have pain with urination. SEEK IMMEDIATE MEDICAL CARE IF:   You have a fever.  You are leaking fluid from your vagina.  You have spotting or bleeding from your vagina.  You have severe abdominal cramping or pain.  You have rapid weight gain or loss.  You have shortness of breath with chest pain.  You notice sudden or extreme swelling of your face, hands, ankles, feet, or legs.  You have not felt your baby move in over an hour.  You have severe headaches that do not go away with medicine.  You have vision changes. Document Released: 08/25/2001 Document Revised: 05/03/2013 Document Reviewed: 11/01/2012 Exit[employer] Patient Information 2014 [employer].  

## 2013-10-17 ENCOUNTER — Other Ambulatory Visit: Payer: Medicaid Other

## 2013-10-31 ENCOUNTER — Other Ambulatory Visit: Payer: Medicaid Other

## 2013-11-09 ENCOUNTER — Encounter: Payer: Medicaid Other | Admitting: Obstetrics & Gynecology

## 2013-11-14 ENCOUNTER — Emergency Department (HOSPITAL_BASED_OUTPATIENT_CLINIC_OR_DEPARTMENT_OTHER)
Admission: EM | Admit: 2013-11-14 | Discharge: 2013-11-14 | Disposition: A | Discharge status: Home / Self Care | Payer: Medicaid Other | Attending: Emergency Medicine | Admitting: Emergency Medicine

## 2013-11-14 ENCOUNTER — Encounter (HOSPITAL_BASED_OUTPATIENT_CLINIC_OR_DEPARTMENT_OTHER): Payer: Self-pay | Admitting: Emergency Medicine

## 2013-11-14 DIAGNOSIS — J40 Bronchitis, not specified as acute or chronic: Secondary | ICD-10-CM

## 2013-11-14 DIAGNOSIS — J209 Acute bronchitis, unspecified: Secondary | ICD-10-CM | POA: Insufficient documentation

## 2013-11-14 DIAGNOSIS — Z79899 Other long term (current) drug therapy: Secondary | ICD-10-CM | POA: Insufficient documentation

## 2013-11-14 MED ORDER — AZITHROMYCIN 250 MG PO TABS: 250.0000 mg | ORAL_TABLET | Freq: Every day | ORAL | Status: DC

## 2013-11-14 NOTE — Discharge Instructions (Signed)

## 2013-11-14 NOTE — ED Provider Notes (Signed)
CSN: [id number]     Arrival date & time 11/14/13  1903 History   First MD Initiated Contact with Patient 11/14/13 2107     Chief Complaint  Patient presents with  . Cough     (Consider location/radiation/quality/duration/timing/severity/associated sxs/prior Treatment) Patient is a 38 y.o. female presenting with cough. The history is provided by the patient. No language interpreter was used.  Cough Cough characteristics:  Productive Sputum characteristics:  Nondescript Severity:  Moderate Onset quality:  Gradual Duration:  6 days Timing:  Constant Progression:  Worsening Chronicity:  New Smoker: no   Relieved by:  Nothing Worsened by:  Nothing tried Ineffective treatments:  None tried Associated symptoms: fever   Associated symptoms: no chest pain     Past Medical History  Diagnosis Date  . No pertinent past medical history    Past Surgical History  Procedure Laterality Date  . No past surgeries     No family history on file. History  Substance Use Topics  . Smoking status: Never Smoker   . Smokeless tobacco: Never Used  . Alcohol Use: No   OB History   Grav Para Term Preterm Abortions TAB SAB Ect Mult Living   4 3 3       3      Review of Systems  Constitutional: Positive for fever.  Respiratory: Positive for cough.   Cardiovascular: Negative for chest pain.  All other systems reviewed and are negative.      Allergies  Review of patient's allergies indicates no known allergies.  Home Medications   Current Outpatient Rx  Name  Route  Sig  Dispense  Refill  . EXPIRED: ibuprofen (ADVIL,MOTRIN) 600 MG tablet   Oral   Take 1 tablet (600 mg total) by mouth every 6 (six) hours.   30 tablet   5   . EXPIRED: oxyCODONE-acetaminophen (PERCOCET) 5-325 MG per tablet   Oral   Take 1-2 tablets by mouth every 3 (three) hours as needed for pain (moderate - severe pain).   40 tablet   0   . prenatal vitamin w/FE, FA (PRENATAL 1 + 1) 27-1 MG TABS   Oral  Take 1 tablet by mouth daily.            BP 109/59  Pulse 94  Temp(Src) 98.2 F (36.8 C) (Oral)  Resp 18  Ht 5\' 4"  (1.626 m)  Wt 140 lb (63.504 kg)  BMI 24.02 kg/m2  SpO2 100% Physical Exam  Nursing note and vitals reviewed. Constitutional: She is oriented to person, place, and time. She appears well-developed and well-nourished.  HENT:  Head: Normocephalic.  Eyes: Conjunctivae and EOM are normal. Pupils are equal, round, and reactive to light.  Neck: Normal range of motion. Neck supple.  Cardiovascular: Normal rate and regular rhythm.   Pulmonary/Chest: Effort normal and breath sounds normal.  Abdominal: She exhibits no distension.  Musculoskeletal: Normal range of motion.  Neurological: She is alert and oriented to person, place, and time.  Skin: Skin is warm.  Psychiatric: She has a normal mood and affect.    ED Course  Procedures (including critical care time) Labs Review Labs Reviewed - No data to display Imaging Review No results found.   EKG Interpretation None      MDM   Final diagnoses:  Bronchitis    Zithromax.   Follow up with [facility] for recheck in 3-4 days.   Return if symptoms worsen or change    Elson AreasLeslie K Maxmilian Trostel, [place] JerseyPA-C 11/14/13 2212  Lonia Skinner Steinauer, PA-C 11/14/13 2212

## 2013-11-14 NOTE — ED Notes (Signed)
Cough for a week. No fever. Headache, runny nose, sore throat, ear pain.

## 2013-11-14 NOTE — ED Notes (Signed)
RT at bs for assessment

## 2013-11-14 NOTE — ED Provider Notes (Signed)
Medical screening examination/treatment/procedure(s) were performed by non-physician practitioner and as supervising physician I was immediately available for consultation/collaboration.   EKG Interpretation None        Makalyn Lennox, MD 11/14/13 2332 

## 2013-11-15 ENCOUNTER — Ambulatory Visit (INDEPENDENT_AMBULATORY_CARE_PROVIDER_SITE_OTHER): Payer: Medicaid Other | Admitting: Obstetrics & Gynecology

## 2013-11-15 VITALS — BP 102/66 | Temp 98.7°F | Wt 159.0 lb

## 2013-11-15 DIAGNOSIS — Z3480 Encounter for supervision of other normal pregnancy, unspecified trimester: Secondary | ICD-10-CM

## 2013-11-15 LAB — POCT URINALYSIS DIPSTICK
BILIRUBIN UA: NEGATIVE
Blood, UA: NEGATIVE
GLUCOSE UA: NEGATIVE
KETONES UA: NEGATIVE
LEUKOCYTES UA: NEGATIVE
Nitrite, UA: NEGATIVE
Protein, UA: NEGATIVE
Spec Grav, UA: 1.02
Urobilinogen, UA: NEGATIVE
pH, UA: 5

## 2013-11-15 NOTE — Progress Notes (Signed)
Pulse 91 Pt states that she has a cold.  Pt was seen at Urgent care and was given azithromycin 250mg .  Pt has also been taking Robitussin DM with little relief of cough.

## 2013-11-19 NOTE — Patient Instructions (Signed)
Glucose Tolerance Test This is a test to see how your body processes carbohydrates. This test is often done to check patients for diabetes or the possibility of developing it. PREPARATION FOR TEST You should have nothing to eat or drink 12 hours before the test. You will be given a form of sugar (glucose) and then blood samples will be drawn from your vein to determine the level of sugar in your blood. Alternatively, blood may be drawn from your finger for testing. You should not smoke or exercise during the test. NORMAL FINDINGS  Fasting: 70-115 mg/dL  30 minutes: less than 200 mg/dL  1 hour: less than 200 mg/dL  2 hours: less than 140 mg/dL  3 hours: 70-115 mg/dL  4 hours: 70-115 mg/dL Ranges for normal findings may vary among different laboratories and hospitals. You should always check with your doctor after having lab work or other tests done to discuss the meaning of your test results and whether your values are considered within normal limits. MEANING OF TEST Your caregiver will go over the test results with you and discuss the importance and meaning of your results, as well as treatment options and the need for additional tests. OBTAINING THE TEST RESULTS It is your responsibility to obtain your test results. Ask the lab or department performing the test when and how you will get your results. Document Released: 09/23/2004 Document Revised: 11/23/2011 Document Reviewed: 08/11/2008 ExitCare Patient Information 2014 [employer].  

## 2013-12-05 ENCOUNTER — Other Ambulatory Visit: Payer: Self-pay | Admitting: Obstetrics & Gynecology

## 2013-12-05 DIAGNOSIS — Z1389 Encounter for screening for other disorder: Secondary | ICD-10-CM

## 2013-12-05 DIAGNOSIS — O09529 Supervision of elderly multigravida, unspecified trimester: Secondary | ICD-10-CM

## 2013-12-12 ENCOUNTER — Other Ambulatory Visit: Payer: Medicaid Other

## 2013-12-12 ENCOUNTER — Encounter: Payer: Self-pay | Admitting: Obstetrics & Gynecology

## 2013-12-12 ENCOUNTER — Ambulatory Visit (INDEPENDENT_AMBULATORY_CARE_PROVIDER_SITE_OTHER): Payer: Medicaid Other

## 2013-12-12 ENCOUNTER — Encounter: Payer: Self-pay | Admitting: Obstetrics

## 2013-12-12 ENCOUNTER — Ambulatory Visit (INDEPENDENT_AMBULATORY_CARE_PROVIDER_SITE_OTHER): Payer: Medicaid Other | Admitting: Obstetrics

## 2013-12-12 VITALS — BP 97/63 | Temp 98.0°F | Wt 158.0 lb

## 2013-12-12 DIAGNOSIS — O09529 Supervision of elderly multigravida, unspecified trimester: Secondary | ICD-10-CM

## 2013-12-12 DIAGNOSIS — Z3480 Encounter for supervision of other normal pregnancy, unspecified trimester: Secondary | ICD-10-CM

## 2013-12-12 DIAGNOSIS — Z1389 Encounter for screening for other disorder: Secondary | ICD-10-CM

## 2013-12-12 LAB — US OB DETAIL + 14 WK

## 2013-12-12 LAB — POCT URINALYSIS DIPSTICK
BILIRUBIN UA: NEGATIVE
Blood, UA: NEGATIVE
GLUCOSE UA: NEGATIVE
Ketones, UA: NEGATIVE
LEUKOCYTES UA: NEGATIVE
Nitrite, UA: NEGATIVE
Protein, UA: NEGATIVE
Spec Grav, UA: 1.01
Urobilinogen, UA: NEGATIVE
pH, UA: 7

## 2013-12-12 LAB — CBC
HEMATOCRIT: 32.6 % — AB (ref 36.0–46.0)
Hemoglobin: 10.9 g/dL — ABNORMAL LOW (ref 12.0–15.0)
MCH: 27.5 pg (ref 26.0–34.0)
MCHC: 33.4 g/dL (ref 30.0–36.0)
MCV: 82.3 fL (ref 78.0–100.0)
Platelets: 198 10*3/uL (ref 150–400)
RBC: 3.96 MIL/uL (ref 3.87–5.11)
RDW: 14.9 % (ref 11.5–15.5)
WBC: 6.5 10*3/uL (ref 4.0–10.5)

## 2013-12-12 LAB — GLUCOSE TOLERANCE, 2 HOURS W/ 1HR
GLUCOSE, 2 HOUR: 96 mg/dL (ref 70–139)
Glucose, 1 hour: 102 mg/dL (ref 70–170)
Glucose, Fasting: 78 mg/dL (ref 70–99)

## 2013-12-12 NOTE — Progress Notes (Signed)
Pulse 83 Pt states that she has had some mild LE swelling that will get better with rest and elevation.  Pt would also like to discuss creams that she may use for facial breakouts.

## 2013-12-13 ENCOUNTER — Other Ambulatory Visit: Payer: Medicaid Other

## 2013-12-13 LAB — HIV ANTIBODY (ROUTINE TESTING W REFLEX): HIV: NONREACTIVE

## 2013-12-13 LAB — RPR

## 2013-12-16 ENCOUNTER — Encounter: Payer: Self-pay | Admitting: Obstetrics

## 2013-12-26 ENCOUNTER — Ambulatory Visit (INDEPENDENT_AMBULATORY_CARE_PROVIDER_SITE_OTHER): Payer: Medicaid Other | Admitting: Obstetrics

## 2013-12-26 VITALS — BP 102/66 | Temp 97.0°F | Wt 158.0 lb

## 2013-12-26 DIAGNOSIS — Z3480 Encounter for supervision of other normal pregnancy, unspecified trimester: Secondary | ICD-10-CM

## 2013-12-26 LAB — POCT URINALYSIS DIPSTICK
Bilirubin, UA: NEGATIVE
Blood, UA: NEGATIVE
Glucose, UA: NEGATIVE
Ketones, UA: NEGATIVE
Leukocytes, UA: NEGATIVE
Nitrite, UA: NEGATIVE
PROTEIN UA: NEGATIVE
SPEC GRAV UA: 1.025
UROBILINOGEN UA: NEGATIVE
pH, UA: 5

## 2013-12-26 NOTE — Progress Notes (Signed)
Pulse: 89

## 2013-12-28 ENCOUNTER — Encounter: Payer: Self-pay | Admitting: Obstetrics

## 2014-01-11 ENCOUNTER — Ambulatory Visit (INDEPENDENT_AMBULATORY_CARE_PROVIDER_SITE_OTHER): Payer: Medicaid Other | Admitting: Obstetrics

## 2014-01-11 ENCOUNTER — Encounter: Payer: Self-pay | Admitting: Obstetrics

## 2014-01-11 VITALS — BP 96/63 | HR 80 | Temp 98.1°F | Wt 157.0 lb

## 2014-01-11 DIAGNOSIS — K219 Gastro-esophageal reflux disease without esophagitis: Secondary | ICD-10-CM

## 2014-01-11 DIAGNOSIS — Z3480 Encounter for supervision of other normal pregnancy, unspecified trimester: Secondary | ICD-10-CM

## 2014-01-11 LAB — POCT URINALYSIS DIPSTICK
Bilirubin, UA: NEGATIVE
Glucose, UA: NEGATIVE
KETONES UA: NEGATIVE
Leukocytes, UA: NEGATIVE
Nitrite, UA: NEGATIVE
PH UA: 5
PROTEIN UA: NEGATIVE
RBC UA: NEGATIVE
Spec Grav, UA: 1.025
Urobilinogen, UA: NEGATIVE

## 2014-01-11 MED ORDER — OMEPRAZOLE 20 MG PO CPDR: 20.0000 mg | DELAYED_RELEASE_CAPSULE | Freq: Two times a day (BID) | ORAL | Status: DC

## 2014-01-11 NOTE — Progress Notes (Signed)
Subjective:    Jacqueline [name] is a 38 y.o. female being seen today for her obstetrical visit. She is at 3167w2d gestation. Patient reports no complaints. Fetal movement: normal.  Problem List Items Addressed This Visit   Supervision of other normal pregnancy - Primary   Relevant Orders      POCT urinalysis dipstick (Completed)    Other Visit Diagnoses   GERD without esophagitis        Relevant Medications       omeprazole (PRILOSEC) capsule      Patient Active Problem List   Diagnosis Date Noted  . Unspecified vitamin D deficiency 09/08/2013  . Supervision of other normal pregnancy 08/31/2013  . Elderly multigravida 08/31/2013   Objective:    BP 96/63  Pulse 80  Temp(Src) 98.1 F (36.7 C)  Wt 157 lb (71.215 kg)  LMP 05/30/2013 FHT:  150 BPM  Uterine Size: size equals dates  Presentation: unsure     Assessment:    Pregnancy @ 4667w2d weeks   Plan:     labs reviewed, problem list updated Consent signed. GBS sent TDAP offered  Rhogam given for RH negative Pediatrician: discussed. Infant feeding: plans to breastfeed. Maternity leave: not discussed. Cigarette smoking: never smoked. Orders Placed This Encounter  Procedures  . POCT urinalysis dipstick   Meds ordered this encounter  Medications  . omeprazole (PRILOSEC) 20 MG capsule    Sig: Take 1 capsule (20 mg total) by mouth 2 (two) times daily before a meal.    Dispense:  60 capsule    Refill:  5   Follow up in 2 Weeks.

## 2014-01-25 ENCOUNTER — Ambulatory Visit (INDEPENDENT_AMBULATORY_CARE_PROVIDER_SITE_OTHER): Payer: Medicaid Other | Admitting: Obstetrics & Gynecology

## 2014-01-25 ENCOUNTER — Encounter: Payer: Self-pay | Admitting: Obstetrics & Gynecology

## 2014-01-25 VITALS — BP 101/68 | HR 80 | Temp 98.1°F | Wt 161.0 lb

## 2014-01-25 DIAGNOSIS — O09529 Supervision of elderly multigravida, unspecified trimester: Secondary | ICD-10-CM

## 2014-01-25 DIAGNOSIS — Z3480 Encounter for supervision of other normal pregnancy, unspecified trimester: Secondary | ICD-10-CM

## 2014-01-25 LAB — POCT URINALYSIS DIPSTICK
Glucose, UA: NEGATIVE
KETONES UA: NEGATIVE
Leukocytes, UA: NEGATIVE
NITRITE UA: NEGATIVE
PH UA: 6
Protein, UA: NEGATIVE
RBC UA: NEGATIVE
SPEC GRAV UA: 1.02

## 2014-01-25 NOTE — Progress Notes (Signed)
Subjective:    Tilda Burrowmal Mohammed is a 38 y.o. female being seen today for her obstetrical visit. She is at 8524w2d  gestation. Patient reports no complaints. Fetal movement: normal.  Problem List Items Addressed This Visit   Supervision of other normal pregnancy - Primary   Relevant Orders      POCT urinalysis dipstick     Patient Active Problem List   Diagnosis Date Noted  . Unspecified vitamin D deficiency 09/08/2013  . Supervision of other normal pregnancy 08/31/2013  . Elderly multigravida 08/31/2013   Objective:    BP 101/68  Pulse 80  Temp(Src) 98.1 F (36.7 C)  Wt 73.029 kg (161 lb)  LMP 05/30/2013 FHT:  150 BPM  Uterine Size: size equals dates  Presentation: unsure     Assessment:    Pregnancy @ 7924w2d  weeks   Plan:     labs reviewed, problem list updated Plans condoms Pediatrician: discussed. Infant feeding: plans to breastfeed. Maternity leave: n/a Cigarette smoking: never smoked. Orders Placed This Encounter  Procedures  . POCT urinalysis dipstick    Follow up in 1 Weeks.

## 2014-01-25 NOTE — Patient Instructions (Signed)
Nonstress Test The nonstress test is a procedure that monitors the fetus's heartbeat. The test will monitor the heartbeat when the fetus is at rest and while the fetus is moving. In a healthy fetus, there will be an increase in fetal heart rate when the fetus moves or kicks. The heart rate will decrease at rest. This test helps determine if the fetus is healthy. The test may take up to a half hour. Your caregiver will look at a number of patterns in the heart rate tracing to make sure your baby is thriving. If there is concern, your caregiver may order additional tests or may suggest another course of action. This test is often done in the third trimester and can help determine if an early delivery is needed and safe. Common reasons to have this test are:  You are past your due date.  You have a high-risk pregnancy.  You are feeling less movement than normal.  You have lost a pregnancy in the past.  Your caregiver suspects fetal growth problems.  You have too much or too little amniotic fluid. BEFORE THE PROCEDURE  Eat a meal right before the test or as directed by your caregiver. Food may help stimulate fetal movements.  Use the restroom right before the test. PROCEDURE  Two belts will be placed around your abdomen. These belts have monitors attached to them. One records the fetal heart rate and the other records uterine contractions.  You may be asked to lie down on your side or to stay sitting upright.  You may be given a button to press when you feel movement.  The fetal heartbeat is listened to and watched on a screen. The heartbeat is recorded on a sheet of paper.  If the fetus seems to be sleeping, you may be asked to drink some juice or soda, gently press your abdomen, or make some noise to wake the fetus. AFTER THE PROCEDURE  Your caregiver will discuss the test results with you and make recommendations for the near future. Document Released: 08/21/2002 Document Revised:  12/26/2012 Document Reviewed: 10/04/2012 Exit[employer] Patient Information 2014 [employer].  

## 2014-02-01 ENCOUNTER — Encounter: Payer: Self-pay | Admitting: Obstetrics & Gynecology

## 2014-02-01 ENCOUNTER — Ambulatory Visit (INDEPENDENT_AMBULATORY_CARE_PROVIDER_SITE_OTHER): Payer: Medicaid Other | Admitting: Obstetrics & Gynecology

## 2014-02-01 VITALS — BP 107/68 | HR 80 | Temp 98.4°F | Wt 159.0 lb

## 2014-02-01 DIAGNOSIS — Z3480 Encounter for supervision of other normal pregnancy, unspecified trimester: Secondary | ICD-10-CM

## 2014-02-01 DIAGNOSIS — O09529 Supervision of elderly multigravida, unspecified trimester: Secondary | ICD-10-CM

## 2014-02-01 LAB — POCT URINALYSIS DIPSTICK
GLUCOSE UA: NEGATIVE
KETONES UA: NEGATIVE
Leukocytes, UA: NEGATIVE
Nitrite, UA: NEGATIVE
Protein, UA: NEGATIVE
RBC UA: NEGATIVE
SPEC GRAV UA: 1.015
pH, UA: 5

## 2014-02-01 NOTE — Progress Notes (Signed)
Subjective:    Jacqueline [name] is a 38 y.o. female being seen today for her obstetrical visit. She is at 3566w2d gestation. Patient reports no complaints. Fetal movement: normal.  Problem List Items Addressed This Visit   Supervision of other normal pregnancy - Primary   Relevant Orders      POCT urinalysis dipstick (Completed)     Patient Active Problem List   Diagnosis Date Noted  . Unspecified vitamin D deficiency 09/08/2013  . Supervision of other normal pregnancy 08/31/2013  . Elderly multigravida 08/31/2013   Objective:    BP 107/68  Pulse 80  Temp(Src) 98.4 F (36.9 C)  Wt 72.122 kg (159 lb)  LMP 05/30/2013 FHT:  140 BPM  Uterine Size: size equals dates  Presentation: cephalic     Assessment:    Pregnancy @ 3166w2d weeks   Plan:     labs reviewed, problem list updated  Orders Placed This Encounter  Procedures  . POCT urinalysis dipstick    Follow up in 1 Weeks.

## 2014-02-01 NOTE — Addendum Note (Signed)
Addended by: Odessa FlemingBOHNE, Chancey Ringel M on: 02/01/2014 02:59 PM   Modules accepted: Orders

## 2014-02-01 NOTE — Patient Instructions (Signed)
Patient information: Group B streptococcus and pregnancy (Beyond the Basics)  Authors Karen M Puopolo, MD, PhD Carol J Baker, MD Section Editors Charles J Lockwood, MD Daniel J Sexton, MD Deputy Editor Vanessa A Barss, MD Disclosures  All topics are updated as new evidence becomes available and our peer review process is complete.  Literature review current through: Feb 2014.  This topic last updated: Mar 14, 2012.  INTRODUCTION - Group B streptococcus (GBS) is a bacterium that can cause serious infections in pregnant women and newborn babies. GBS is one of many types of streptococcal bacteria, sometimes called "strep." This article discusses GBS, its effect on pregnant women and infants, and ways to prevent complications of GBS. More detailed information about GBS is available by subscription. (See "Group B streptococcal infection in pregnant women".) WHAT IS GROUP B STREP INFECTION? - GBS is commonly found in the digestive system and the vagina. In healthy adults, GBS is not harmful and does not cause problems. But in pregnant women and newborn infants, being infected with GBS can cause serious illness. Approximately one in three to four pregnant women in the [place] carries GBS in their gastrointestinal system and/or in their vagina. Carrying GBS is not the same as being infected. Carriers are not sick and do not need treatment during pregnancy. There is no treatment that can stop you from carrying GBS.  Pregnant women who are carriers of GBS infrequently become infected with GBS. GBS can cause urinary tract infections, infection of the amniotic fluid (bag of water), and infection of the uterus after delivery. GBS infections during pregnancy may lead to preterm labor.  Pregnant women who carry GBS can pass on the bacteria to their newborns, and some of those babies become infected with GBS. Newborns who are infected with GBS can develop pneumonia (lung infection), septicemia (blood infection), or  meningitis (infection of the lining of the brain and spinal cord). These complications can be prevented by giving intravenous antibiotics during labor to any woman who is at risk of GBS infection. You are at risk of GBS infection if: You have a urine culture during your current pregnancy showing GBS  You have a vaginal and rectal culture during your current pregnancy showing GBS  You had an infant infected with GBS in the past GROUP B STREP PREVENTION - Most doctors and nurses recommend a urine culture early in your pregnancy to be sure that you do not have a bladder infection without symptoms. If you urine culture shows GBS or other bacteria, you may be treated with an antibiotic. If you have symptoms of urinary infection, such as pain with urination, any time during your pregnancy, a urine culture is done. If GBS grows from the urine culture, it should be treated with an antibiotic, and you should also receive intravenous antibiotics during labor. Expert groups recommend that all pregnant women have a GBS culture at 35 to 37 weeks of pregnancy. The culture is done by swabbing the vagina and rectum. If your GBS culture is positive, you will be given an intravenous antibiotic during labor. If you have preterm labor, the culture is done then and an intravenous antibiotic is given until the baby is born or the labor is stopped by your health care provider. If you have a positive GBS culture and you have an allergy to penicillin, be sure your doctor and nurse are aware of this allergy and tell them what happened with the allergy. If you had only a rash or itching, this   is not a serious allergy, and you can receive a common drug related to the penicillin. If you had a serious allergy (for example, trouble breathing, swelling of your face) you may need an additional test to determine which antibiotic should be used during labor. Being treated with an antibiotic during labor greatly reduces the chance that you or  your newborn will develop infections related to GBS. It is important to note that young infants up to age 3 months can also develop septicemia, meningitis and other serious infections from GBS. Being treated with an antibiotic during labor does not reduce the chance that your baby will develop this later type of infection. There is currently no known way of preventing this later-onset GBS disease. WHERE TO GET MORE INFORMATION - Your healthcare provider is the best source of information for questions and concerns related to your medical problem.  

## 2014-02-03 LAB — STREP B DNA PROBE: STREP GROUP B AG: NOT DETECTED

## 2014-02-08 ENCOUNTER — Encounter: Payer: Self-pay | Admitting: Obstetrics & Gynecology

## 2014-02-08 ENCOUNTER — Ambulatory Visit (INDEPENDENT_AMBULATORY_CARE_PROVIDER_SITE_OTHER): Payer: Medicaid Other | Admitting: Obstetrics & Gynecology

## 2014-02-08 VITALS — BP 98/64 | HR 93 | Temp 98.5°F | Wt 159.0 lb

## 2014-02-08 DIAGNOSIS — O365990 Maternal care for other known or suspected poor fetal growth, unspecified trimester, not applicable or unspecified: Secondary | ICD-10-CM

## 2014-02-08 DIAGNOSIS — O09529 Supervision of elderly multigravida, unspecified trimester: Secondary | ICD-10-CM

## 2014-02-08 DIAGNOSIS — IMO0002 Reserved for concepts with insufficient information to code with codable children: Secondary | ICD-10-CM

## 2014-02-08 DIAGNOSIS — Z3480 Encounter for supervision of other normal pregnancy, unspecified trimester: Secondary | ICD-10-CM

## 2014-02-08 LAB — POCT URINALYSIS DIPSTICK
Blood, UA: NEGATIVE
GLUCOSE UA: NEGATIVE
KETONES UA: NEGATIVE
Nitrite, UA: NEGATIVE
Spec Grav, UA: 1.02
pH, UA: 5

## 2014-02-09 NOTE — Progress Notes (Signed)
Subjective:    Jacqueline [name] is a 38 y.o. female being seen today for her obstetrical visit. She is at [redacted]w[redacted]d  gestation. Patient reports no complaints. Fetal movement: normal.  Problem List Items Addressed This Visit   Supervision of other normal pregnancy - Primary   Relevant Orders      POCT urinalysis dipstick (Completed)    Other Visit Diagnoses   Advanced maternal age in pregnancy        Relevant Orders       Fetal non-stress test    Poor fetal growth, affecting management of mother, antepartum condition or complication        Relevant Orders       US OB Follow Up      Patient Active Problem List   Diagnosis Date Noted  . Unspecified vitamin D deficiency 09/08/2013  . Supervision of other normal pregnancy 08/31/2013  . Elderly multigravida 08/31/2013   Objective:    BP 98/64  Pulse 93  Temp(Src) 98.5 F (36.9 C)  Wt 72.122 kg (159 lb)  LMP 05/30/2013 FHT:  140 BPM  Uterine Size: size equals dates  Presentation: cephalic     Assessment:    Pregnancy @ [redacted]w[redacted]d  weeks   Plan:     labs reviewed, problem list updated  Orders Placed This Encounter  Procedures  . US OB Follow Up    Standing Status: Future     Number of Occurrences:      Standing Expiration Date: 04/11/2015    Order Specific Question:  Reason for Exam (SYMPTOM  OR DIAGNOSIS REQUIRED)    Answer:  growth    Order Specific Question:  Preferred imaging location?    Answer:  Internal  . Fetal non-stress test    Standing Status: Standing     Number of Occurrences: 1     Standing Expiration Date:   . POCT urinalysis dipstick    Follow up in 1 Weeks.

## 2014-02-19 ENCOUNTER — Encounter: Payer: Medicaid Other | Admitting: Obstetrics & Gynecology

## 2014-02-21 ENCOUNTER — Encounter: Payer: Self-pay | Admitting: Obstetrics

## 2014-02-21 ENCOUNTER — Ambulatory Visit (INDEPENDENT_AMBULATORY_CARE_PROVIDER_SITE_OTHER): Payer: Medicaid Other

## 2014-02-21 ENCOUNTER — Ambulatory Visit (INDEPENDENT_AMBULATORY_CARE_PROVIDER_SITE_OTHER): Payer: Medicaid Other | Admitting: Obstetrics

## 2014-02-21 VITALS — BP 104/67 | HR 94 | Temp 98.2°F | Wt 161.0 lb

## 2014-02-21 DIAGNOSIS — Z3480 Encounter for supervision of other normal pregnancy, unspecified trimester: Secondary | ICD-10-CM

## 2014-02-21 DIAGNOSIS — O365990 Maternal care for other known or suspected poor fetal growth, unspecified trimester, not applicable or unspecified: Secondary | ICD-10-CM

## 2014-02-21 DIAGNOSIS — O09529 Supervision of elderly multigravida, unspecified trimester: Secondary | ICD-10-CM

## 2014-02-21 LAB — POCT URINALYSIS DIPSTICK
Bilirubin, UA: NEGATIVE
Blood, UA: NEGATIVE
Glucose, UA: NEGATIVE
Ketones, UA: NEGATIVE
Nitrite, UA: NEGATIVE
PH UA: 6
SPEC GRAV UA: 1.015
UROBILINOGEN UA: NEGATIVE

## 2014-02-21 LAB — US OB FOLLOW UP

## 2014-02-21 NOTE — Progress Notes (Signed)
Subjective:    Jacqueline [name] is a 38 y.o. female being seen today for her obstetrical visit. She is at [redacted]w[redacted]d gestation. Patient reports no complaints. Fetal movement: normal.  Problem List Items Addressed This Visit   Supervision of other normal pregnancy - Primary   Relevant Orders      POCT urinalysis dipstick (Completed)     Patient Active Problem List   Diagnosis Date Noted  . Unspecified vitamin D deficiency 09/08/2013  . Supervision of other normal pregnancy 08/31/2013  . Elderly multigravida 08/31/2013    Objective:    BP 104/67  Pulse 94  Temp(Src) 98.2 F (36.8 C)  Wt 161 lb (73.029 kg)  LMP 05/30/2013 FHT: 150 BPM  Uterine Size: size equals dates  Presentations: cephalic  Pelvic Exam: Deferred    Assessment:    Pregnancy @ [redacted]w[redacted]d weeks   Plan:   Plans for delivery: Vaginal anticipated; labs reviewed; problem list updated Counseling: Consent signed. Infant feeding: plans to breastfeed. Cigarette smoking: never smoked. L&D discussion: symptoms of labor, discussed when to call, discussed what number to call, anesthetic/analgesic options reviewed and delivering clinician:  plans Physician. Postpartum supports and preparation: circumcision discussed and contraception plans discussed.  Follow up in 1 Week.

## 2014-02-26 ENCOUNTER — Ambulatory Visit (INDEPENDENT_AMBULATORY_CARE_PROVIDER_SITE_OTHER): Payer: Medicaid Other | Admitting: Obstetrics & Gynecology

## 2014-02-26 ENCOUNTER — Other Ambulatory Visit: Payer: Self-pay | Admitting: *Deleted

## 2014-02-26 ENCOUNTER — Encounter: Payer: Self-pay | Admitting: Obstetrics & Gynecology

## 2014-02-26 VITALS — BP 110/70 | HR 84 | Temp 98.4°F | Wt 162.0 lb

## 2014-02-26 DIAGNOSIS — Z3480 Encounter for supervision of other normal pregnancy, unspecified trimester: Secondary | ICD-10-CM

## 2014-02-26 DIAGNOSIS — O09529 Supervision of elderly multigravida, unspecified trimester: Secondary | ICD-10-CM

## 2014-02-26 NOTE — Progress Notes (Signed)
Subjective:    Jacqueline [name] is a 38 y.o. female being seen today for her obstetrical visit. She is at 2460w6d gestation. Patient reports no complaints. Fetal movement: normal.  Problem List Items Addressed This Visit   Supervision of other normal pregnancy - Primary   Relevant Orders      POCT urinalysis dipstick     Patient Active Problem List   Diagnosis Date Noted  . Unspecified vitamin D deficiency 09/08/2013  . Supervision of other normal pregnancy 08/31/2013  . Elderly multigravida 08/31/2013    Objective:    BP 110/70  Pulse 84  Temp(Src) 98.4 F (36.9 C)  Wt 73.483 kg (162 lb)  LMP 05/30/2013 FHT: 140 BPM  Uterine Size: size equals dates  Presentations: cephalic  Pelvic Exam: Deferred   EFW by Leopold's: 3300 gm Assessment:    Pregnancy @ 2560w6d weeks   Plan:   Plans for delivery: Vaginal anticipated; labs reviewed; problem list updated IOL this week

## 2014-02-26 NOTE — Patient Instructions (Signed)
Labor Induction  Labor induction is when steps are taken to cause a pregnant woman to begin the labor process. Most women go into labor on their own between 37 weeks and 42 weeks of the pregnancy. When this does not happen or when there is a medical need, methods may be used to induce labor. Labor induction causes a pregnant woman's uterus to contract. It also causes the cervix to soften (ripen), open (dilate), and thin out (efface). Usually, labor is not induced before 39 weeks of the pregnancy unless there is a problem with the baby or mother.  Before inducing labor, your health care provider will consider a number of factors, including the following:  The medical condition of you and the baby.   How many weeks along you are.   The status of the baby's lung maturity.   The condition of the cervix.   The position of the baby.  WHAT ARE THE REASONS FOR LABOR INDUCTION? Labor may be induced for the following reasons:  The health of the baby or mother is at risk.   The pregnancy is overdue by 1 week or more.   The water breaks but labor does not start on its own.   The mother has a health condition or serious illness, such as high blood pressure, infection, placental abruption, or diabetes.  The amniotic fluid amounts are low around the baby.   The baby is distressed.  Convenience or wanting the baby to be born on a certain date is not a reason for inducing labor. WHAT METHODS ARE USED FOR LABOR INDUCTION? Several methods of labor induction may be used, such as:   Prostaglandin medicine. This medicine causes the cervix to dilate and ripen. The medicine will also start contractions. It can be taken by mouth or by inserting a suppository into the vagina.   Inserting a thin tube (catheter) with a balloon on the end into the vagina to dilate the cervix. Once inserted, the balloon is expanded with water, which causes the cervix to open.   Stripping the membranes. Your health  care provider separates amniotic sac tissue from the cervix, causing the cervix to be stretched and causing the release of a hormone called progesterone. This may cause the uterus to contract. It is often done during an office visit. You will be sent home to wait for the contractions to begin. You will then come in for an induction.   Breaking the water. Your health care provider makes a hole in the amniotic sac using a small instrument. Once the amniotic sac breaks, contractions should begin. This may still take hours to see an effect.   Medicine to trigger or strengthen contractions. This medicine is given through an IV access tube inserted into a vein in your arm.  All of the methods of induction, besides stripping the membranes, will be done in the hospital. Induction is done in the hospital so that you and the baby can be carefully monitored.  HOW LONG DOES IT TAKE FOR LABOR TO BE INDUCED? Some inductions can take up to 2 3 days. Depending on the cervix, it usually takes less time. It takes longer when you are induced early in the pregnancy or if this is your first pregnancy. If a mother is still pregnant and the induction has been going on for 2 3 days, either the mother will be sent home or a cesarean delivery will be needed. WHAT ARE THE RISKS ASSOCIATED WITH LABOR INDUCTION? Some of the risks   of induction include:   Changes in fetal heart rate, such as too high, too low, or erratic.   Fetal distress.   Chance of infection for the mother and baby.   Increased chance of having a cesarean delivery.   Breaking off (abruption) of the placenta from the uterus (rare).   Uterine rupture (very rare).  When induction is needed for medical reasons, the benefits of induction may outweigh the risks. WHAT ARE SOME REASONS FOR NOT INDUCING LABOR? Labor induction should not be done if:   It is shown that your baby does not tolerate labor.   You have had previous surgeries on your  uterus, such as a myomectomy or the removal of fibroids.   Your placenta lies very low in the uterus and blocks the opening of the cervix (placenta previa).   Your baby is not in a head-down position.   The umbilical cord drops down into the birth canal in front of the baby. This could cut off the baby's blood and oxygen supply.   You have had a previous cesarean delivery.   There are unusual circumstances, such as the baby being extremely premature.  Document Released: 01/20/2007 Document Revised: 05/03/2013 Document Reviewed: 03/30/2013 [employer] Patient Information 2014 [employer].  

## 2014-02-27 ENCOUNTER — Telehealth (HOSPITAL_COMMUNITY): Payer: Self-pay | Admitting: *Deleted

## 2014-02-27 NOTE — Telephone Encounter (Signed)
Preadmission screen  

## 2014-02-28 ENCOUNTER — Inpatient Hospital Stay (HOSPITAL_COMMUNITY)
Admission: RE | Admit: 2014-02-28 | Discharge: 2014-03-02 | DRG: 775 | Disposition: A | Discharge status: Home / Self Care | Payer: Medicaid Other | Source: Ambulatory Visit | Attending: Obstetrics & Gynecology | Admitting: Obstetrics & Gynecology

## 2014-02-28 ENCOUNTER — Encounter (HOSPITAL_COMMUNITY): Payer: Self-pay

## 2014-02-28 ENCOUNTER — Inpatient Hospital Stay (HOSPITAL_COMMUNITY): Payer: Medicaid Other | Admitting: Anesthesiology

## 2014-02-28 ENCOUNTER — Encounter (HOSPITAL_COMMUNITY): Payer: Medicaid Other | Admitting: Anesthesiology

## 2014-02-28 DIAGNOSIS — Z833 Family history of diabetes mellitus: Secondary | ICD-10-CM

## 2014-02-28 DIAGNOSIS — O09529 Supervision of elderly multigravida, unspecified trimester: Secondary | ICD-10-CM | POA: Diagnosis present

## 2014-02-28 DIAGNOSIS — Z8249 Family history of ischemic heart disease and other diseases of the circulatory system: Secondary | ICD-10-CM

## 2014-02-28 LAB — CBC
HCT: 34.9 % — ABNORMAL LOW (ref 36.0–46.0)
HEMOGLOBIN: 11.7 g/dL — AB (ref 12.0–15.0)
MCH: 27.1 pg (ref 26.0–34.0)
MCHC: 33.5 g/dL (ref 30.0–36.0)
MCV: 81 fL (ref 78.0–100.0)
Platelets: 170 10*3/uL (ref 150–400)
RBC: 4.31 MIL/uL (ref 3.87–5.11)
RDW: 14.6 % (ref 11.5–15.5)
WBC: 7.3 10*3/uL (ref 4.0–10.5)

## 2014-02-28 LAB — RPR

## 2014-02-28 MED ORDER — FERROUS SULFATE 325 (65 FE) MG PO TABS
325.0000 mg | ORAL_TABLET | Freq: Two times a day (BID) | ORAL | Status: DC
  Administered 2014-02-28 – 2014-03-01 (×3): 325 mg via ORAL
  Filled 2014-02-28 (×4): qty 1

## 2014-02-28 MED ORDER — BUTORPHANOL TARTRATE 1 MG/ML IJ SOLN: 1.0000 mg | INTRAMUSCULAR | Status: DC | PRN

## 2014-02-28 MED ORDER — FENTANYL 2.5 MCG/ML BUPIVACAINE 1/10 % EPIDURAL INFUSION (WH - ANES)
14.0000 mL/h | INTRAMUSCULAR | Status: DC | PRN
  Administered 2014-02-28 (×2): 14 mL/h via EPIDURAL
  Filled 2014-02-28: qty 125

## 2014-02-28 MED ORDER — SENNOSIDES-DOCUSATE SODIUM 8.6-50 MG PO TABS
2.0000 | ORAL_TABLET | ORAL | Status: DC
  Administered 2014-02-28 – 2014-03-01 (×2): 2 via ORAL
  Filled 2014-02-28 (×2): qty 2

## 2014-02-28 MED ORDER — FENTANYL 2.5 MCG/ML BUPIVACAINE 1/10 % EPIDURAL INFUSION (WH - ANES): 14.0000 mL/h | INTRAMUSCULAR | Status: DC | PRN

## 2014-02-28 MED ORDER — MEASLES, MUMPS & RUBELLA VAC ~~LOC~~ INJ
0.5000 mL | INJECTION | Freq: Once | SUBCUTANEOUS | Status: DC
  Filled 2014-02-28: qty 0.5

## 2014-02-28 MED ORDER — OXYTOCIN BOLUS FROM INFUSION: 500.0000 mL | INTRAVENOUS | Status: DC

## 2014-02-28 MED ORDER — LIDOCAINE HCL (PF) 1 % IJ SOLN
INTRAMUSCULAR | Status: DC | PRN
  Administered 2014-02-28: 10 mL

## 2014-02-28 MED ORDER — MAGNESIUM HYDROXIDE 400 MG/5ML PO SUSP: 30.0000 mL | ORAL | Status: DC | PRN

## 2014-02-28 MED ORDER — ACETAMINOPHEN 325 MG PO TABS: 650.0000 mg | ORAL_TABLET | ORAL | Status: DC | PRN

## 2014-02-28 MED ORDER — OXYTOCIN 40 UNITS IN LACTATED RINGERS INFUSION - SIMPLE MED: 62.5000 mL/h | INTRAVENOUS | Status: DC

## 2014-02-28 MED ORDER — ONDANSETRON HCL 4 MG/2ML IJ SOLN: 4.0000 mg | INTRAMUSCULAR | Status: DC | PRN

## 2014-02-28 MED ORDER — ONDANSETRON HCL 4 MG PO TABS: 4.0000 mg | ORAL_TABLET | ORAL | Status: DC | PRN

## 2014-02-28 MED ORDER — TETANUS-DIPHTH-ACELL PERTUSSIS 5-2.5-18.5 LF-MCG/0.5 IM SUSP: 0.5000 mL | Freq: Once | INTRAMUSCULAR | Status: DC

## 2014-02-28 MED ORDER — PRENATAL MULTIVITAMIN CH
1.0000 | ORAL_TABLET | Freq: Every day | ORAL | Status: DC
  Administered 2014-03-01: 1 via ORAL
  Filled 2014-02-28: qty 1

## 2014-02-28 MED ORDER — DIPHENHYDRAMINE HCL 50 MG/ML IJ SOLN: 12.5000 mg | INTRAMUSCULAR | Status: DC | PRN

## 2014-02-28 MED ORDER — OXYCODONE-ACETAMINOPHEN 5-325 MG PO TABS: 1.0000 | ORAL_TABLET | ORAL | Status: DC | PRN

## 2014-02-28 MED ORDER — EPHEDRINE 5 MG/ML INJ
10.0000 mg | INTRAVENOUS | Status: DC | PRN
  Filled 2014-02-28: qty 2
  Filled 2014-02-28: qty 4

## 2014-02-28 MED ORDER — OXYTOCIN 40 UNITS IN LACTATED RINGERS INFUSION - SIMPLE MED
1.0000 m[IU]/min | INTRAVENOUS | Status: DC
  Administered 2014-02-28: 4 m[IU]/min via INTRAVENOUS
  Administered 2014-02-28: 2 m[IU]/min via INTRAVENOUS
  Filled 2014-02-28: qty 1000

## 2014-02-28 MED ORDER — LANOLIN HYDROUS EX OINT: TOPICAL_OINTMENT | CUTANEOUS | Status: DC | PRN

## 2014-02-28 MED ORDER — LIDOCAINE HCL (PF) 1 % IJ SOLN
30.0000 mL | INTRAMUSCULAR | Status: DC | PRN
  Administered 2014-02-28: 30 mL via SUBCUTANEOUS
  Filled 2014-02-28: qty 30

## 2014-02-28 MED ORDER — TERBUTALINE SULFATE 1 MG/ML IJ SOLN: 0.2500 mg | Freq: Once | INTRAMUSCULAR | Status: DC | PRN

## 2014-02-28 MED ORDER — LACTATED RINGERS IV SOLN: 500.0000 mL | INTRAVENOUS | Status: DC | PRN

## 2014-02-28 MED ORDER — ONDANSETRON HCL 4 MG/2ML IJ SOLN: 4.0000 mg | Freq: Four times a day (QID) | INTRAMUSCULAR | Status: DC | PRN

## 2014-02-28 MED ORDER — CITRIC ACID-SODIUM CITRATE 334-500 MG/5ML PO SOLN: 30.0000 mL | ORAL | Status: DC | PRN

## 2014-02-28 MED ORDER — DIPHENHYDRAMINE HCL 25 MG PO CAPS: 25.0000 mg | ORAL_CAPSULE | Freq: Four times a day (QID) | ORAL | Status: DC | PRN

## 2014-02-28 MED ORDER — EPHEDRINE 5 MG/ML INJ
10.0000 mg | INTRAVENOUS | Status: DC | PRN
  Filled 2014-02-28: qty 2

## 2014-02-28 MED ORDER — PHENYLEPHRINE 40 MCG/ML (10ML) SYRINGE FOR IV PUSH (FOR BLOOD PRESSURE SUPPORT)
80.0000 ug | PREFILLED_SYRINGE | INTRAVENOUS | Status: DC | PRN
  Filled 2014-02-28: qty 2
  Filled 2014-02-28: qty 10

## 2014-02-28 MED ORDER — BENZOCAINE-MENTHOL 20-0.5 % EX AERO
1.0000 "application " | INHALATION_SPRAY | CUTANEOUS | Status: DC | PRN
  Administered 2014-03-01: 1 via TOPICAL
  Filled 2014-02-28: qty 56

## 2014-02-28 MED ORDER — IBUPROFEN 600 MG PO TABS: 600.0000 mg | ORAL_TABLET | Freq: Four times a day (QID) | ORAL | Status: DC | PRN

## 2014-02-28 MED ORDER — ZOLPIDEM TARTRATE 5 MG PO TABS: 5.0000 mg | ORAL_TABLET | Freq: Every evening | ORAL | Status: DC | PRN

## 2014-02-28 MED ORDER — LACTATED RINGERS IV SOLN: 500.0000 mL | Freq: Once | INTRAVENOUS | Status: DC

## 2014-02-28 MED ORDER — LACTATED RINGERS IV SOLN
INTRAVENOUS | Status: DC
  Administered 2014-02-28: 08:00:00 via INTRAVENOUS

## 2014-02-28 MED ORDER — PHENYLEPHRINE 40 MCG/ML (10ML) SYRINGE FOR IV PUSH (FOR BLOOD PRESSURE SUPPORT)
80.0000 ug | PREFILLED_SYRINGE | INTRAVENOUS | Status: DC | PRN
  Filled 2014-02-28: qty 2

## 2014-02-28 MED ORDER — HYDROXYZINE HCL 50 MG PO TABS
50.0000 mg | ORAL_TABLET | Freq: Four times a day (QID) | ORAL | Status: DC | PRN
  Filled 2014-02-28: qty 1

## 2014-02-28 MED ORDER — IBUPROFEN 600 MG PO TABS
600.0000 mg | ORAL_TABLET | Freq: Four times a day (QID) | ORAL | Status: DC
  Administered 2014-02-28 – 2014-03-02 (×7): 600 mg via ORAL
  Filled 2014-02-28 (×7): qty 1

## 2014-02-28 MED ORDER — DIBUCAINE 1 % RE OINT: 1.0000 "application " | TOPICAL_OINTMENT | RECTAL | Status: DC | PRN

## 2014-02-28 MED ORDER — OXYCODONE-ACETAMINOPHEN 5-325 MG PO TABS
1.0000 | ORAL_TABLET | ORAL | Status: DC | PRN
  Administered 2014-03-01 – 2014-03-02 (×3): 1 via ORAL
  Filled 2014-02-28 (×3): qty 1

## 2014-02-28 MED ORDER — WITCH HAZEL-GLYCERIN EX PADS: 1.0000 "application " | MEDICATED_PAD | CUTANEOUS | Status: DC | PRN

## 2014-02-28 NOTE — H&P (Signed)
Jacqueline [name] is [name] 38 y.o. female presenting for IOL. Maternal Medical History:  Reason for admission: She presents for IOL secondary to AMA.  Fetal activity: Perceived fetal activity is normal.    Prenatal complications: no prenatal complications Prenatal Complications - Diabetes: none.    OB History   Grav Para Term Preterm Abortions TAB SAB Ect Mult Living   4 3 3       3      Past Medical History  Diagnosis Date  . Medical history non-contributory    Past Surgical History  Procedure Laterality Date  . No past surgeries     Family History: family history includes Diabetes in her father; Hypertension in her father and mother. Social History:  reports that she has never smoked. She does not have any smokeless tobacco history on file. She reports that she does not drink alcohol or use illicit drugs.     Review of Systems  Constitutional: Negative for fever.  Eyes: Negative for blurred vision.  Respiratory: Negative for shortness of breath.   Gastrointestinal: Negative for vomiting.  Skin: Negative for rash.  Neurological: Negative for headaches.    Dilation: 3.5 Effacement (%): 70 Station: -2 Exam by:: dr Jacqueline [name] Blood pressure 87/49, pulse 93, temperature 98.3 F (36.8 C), temperature source Oral, resp. rate 20, height 5\' 4"  (1.626 m), weight 73.483 kg (162 lb), last menstrual period 05/30/2013, SpO2 100.00%, unknown if currently breastfeeding. Maternal Exam:  Abdomen: Fetal presentation: vertex  Introitus: Normal vulva.   Fetal Exam Fetal Monitor Review: Baseline rate: 140.  Variability: moderate (6-25 bpm).   Pattern: accelerations present and no decelerations.    Fetal State Assessment: Category I - tracings are normal.     Physical Exam  Constitutional: She appears well-developed.  HENT:  Head: Normocephalic.  Neck: Neck supple. No thyromegaly present.  Cardiovascular: Normal rate and regular rhythm.   Respiratory: Breath sounds normal.  GI:  Soft. Bowel sounds are normal.  Skin: No rash noted.    Prenatal labs: ABO, Rh: B/POS/-- (12/18 1043) Antibody: NEG (12/18 1043) Rubella: 19.70 (12/18 1043) RPR: NON REAC (06/17 0745)  HBsAg: NEGATIVE (12/18 1043)  HIV: NON REACTIVE (03/31 1249)  GBS: NOT DETECTED (05/21 1501)   Assessment/Plan: Multipara @ 4024w1d.  AMA--38 yo.  Favorable Bishop score for IOL.  Category I FHT.  Admit Low dose Pitocin protocol Anticipate an NSVD    Jacqueline [name] 02/28/2014, 1:39 PM

## 2014-02-28 NOTE — Progress Notes (Signed)
Ronnald Rampmal Resh is a 38 y.o. [id number] at 6166w1d by LMP [facility]  Subjective: C/O pressure  Objective: BP 94/60  Pulse 88  Temp(Src) 98.3 F (36.8 C) (Oral)  Resp 20  Ht 5\' 4"  (1.626 m)  Wt 73.483 kg (162 lb)  BMI 27.79 kg/m2  SpO2 100%  LMP 05/30/2013      FHT:  FHR: 140 bpm, variability: moderate,  accelerations:  Present,  decelerations:  Absent UC:   regular, every 3 minutes SVE:   Dilation: 6.5 Effacement (%): 70 Station: -2 Exam by:: dr Tamela Oddijackson moore  Labs: Lab Results  Component Value Date   WBC 7.3 02/28/2014   HGB 11.7* 02/28/2014   HCT 34.9* 02/28/2014   MCV 81.0 02/28/2014   PLT 170 02/28/2014    Assessment / Plan: Late latent labor  Labor: see above Preeclampsia: n/a Fetal Wellbeing:  Category I Pain Control:  Epidural I/D:  n/a Anticipated MOD:  NSVD  [name],[name] 02/28/2014, 2:19 PM

## 2014-02-28 NOTE — Anesthesia Preprocedure Evaluation (Signed)

## 2014-02-28 NOTE — Lactation Note (Signed)
This note was copied from the chart of Jacqueline Ronnald Rampmal [place]. Lactation Consultation Note  [facility] given and discussed.  Encouraged to feed with early cues on demand.  Early newborn behavior discussed.  Hand expression demonstrated with colostrum visible, mom is able to return demonstration.  Mom has breast fed 3 older children for > 1 year each.  Mom denies pain.  Mom to call for assist as needed.    Patient Name: Jacqueline [name] ZOXWR'UToday's Date: 02/28/2014 Reason for consult: Initial assessment   Maternal Data Has patient been taught Hand Expression?: Yes Does the patient have breastfeeding experience prior to this delivery?: Yes  Feeding Feeding Type: Breast Fed  LATCH Score/Interventions Latch: Grasps breast easily, tongue down, lips flanged, rhythmical sucking.  Audible Swallowing: A few with stimulation  Type of Nipple: Everted at rest and after stimulation  Comfort (Breast/Nipple): Soft / non-tender     Hold (Positioning): No assistance needed to correctly position infant at breast. Intervention(s): Breastfeeding basics reviewed  LATCH Score: 9  Lactation Tools Discussed/Used     Consult Status Consult Status: Follow-up Date: 03/01/14 Follow-up type: In-patient    Shoptaw, Arvella MerlesJana Lynn 02/28/2014, 10:57 PM

## 2014-02-28 NOTE — Anesthesia Procedure Notes (Signed)

## 2014-03-01 NOTE — Progress Notes (Signed)
UR completed 

## 2014-03-01 NOTE — Lactation Note (Signed)
This note was copied from the chart of Jacqueline Ronnald Rampmal [place]. Lactation Consultation Note  Patient Name: Jacqueline [name] ZOXWR'UToday's [name]: 03/01/2014 Reason for consult: Follow-up assessment Mom had baby latched when I arrived. LC adjusted position slightly for baby to obtain more depth with latch. Gave Mom pillows for support. Mom is experienced BF, she has 3 older children that she BF for greaster than 1 year. Mom denies questions or concerns. Encouraged Mom to call if she would like LC assist.   Maternal Data Formula Feeding for Exclusion: No Infant to breast within first hour of birth: Yes  Feeding Feeding Type: Breast Fed  LATCH Score/Interventions                      Lactation Tools Discussed/Used     Consult Status Consult Status: PRN [name]: 03/02/14 Follow-up type: In-patient    Alfred LevinsGranger, Kathy Ann 03/01/2014, 12:07 PM

## 2014-03-01 NOTE — Progress Notes (Signed)
Post Partum Day 1 Subjective: no complaints  Objective: Blood pressure 98/63, pulse 72, temperature 98.4 F (36.9 C), temperature source Oral, resp. rate 16, height 5\' 4"  (1.626 m), weight 162 lb (73.483 kg), last menstrual period 05/30/2013, SpO2 100.00%, unknown if currently breastfeeding.  Physical Exam:  General: alert and no distress Lochia: appropriate Uterine Fundus: firm Incision: healing well DVT Evaluation: No evidence of DVT seen on physical exam.   Recent Labs  02/28/14 0745  HGB 11.7*  HCT 34.9*    Assessment/Plan: Plan for discharge tomorrow   LOS: 1 day   HARPER,CHARLES A 03/01/2014, 9:31 AM

## 2014-03-01 NOTE — Anesthesia Postprocedure Evaluation (Signed)
  Anesthesia Post-op Note  Patient: Jacqueline [name]  Procedure(s) Performed: * No procedures listed *  Patient Location: Mother/Baby  Anesthesia Type:Epidural  Level of Consciousness: awake, alert  and patient cooperative  Airway and Oxygen Therapy: Patient Spontanous Breathing  Post-op Pain: none  Post-op Assessment: Patient's Cardiovascular Status Stable, Respiratory Function Stable, Patent Airway, No signs of Nausea or vomiting, Adequate PO intake, Pain level controlled and No headache  Post-op Vital Signs: Reviewed and stable  Last Vitals:  Filed Vitals:   03/01/14 0605  BP: 98/63  Pulse: 72  Temp: 36.9 C  Resp: 16    Complications: No apparent anesthesia complications

## 2014-03-02 MED ORDER — IBUPROFEN 600 MG PO TABS: 600.0000 mg | ORAL_TABLET | Freq: Four times a day (QID) | ORAL | Status: DC | PRN

## 2014-03-02 MED ORDER — OXYCODONE-ACETAMINOPHEN 5-325 MG PO TABS: 1.0000 | ORAL_TABLET | ORAL | Status: DC | PRN

## 2014-03-02 NOTE — Discharge Instructions (Signed)
[facility] Ask any questions about feeding, diapering, and baby care before you leave the hospital. Ask again if you do not understand. Ask when you need to see the doctor again. There are several things you must have before your baby comes home.  Infant car seat.  Crib.  Do not let your baby sleep in a bed with you or anyone else.  If you do not have a bed for your baby, ask the doctor what you can use that will be safe for the baby to sleep in. Infant feeding supplies:  6 to 8 bottles (8 oz. size).  6 to 8 nipples.  Measuring cup.  Measuring tablespoon.  Bottle brush.  Sterilizer (or use any large pan or kettle with a lid).  Formula that contains iron.  A way to boil and cool water. Breastfeeding supplies:  Breast pump.  Nipple cream. Clothing:  24 to 36 cloth diapers and waterproof diaper covers or a box of disposable diapers. You may need as many as 10 to 12 diapers per day.  3 onesies (other clothing will depend on the time of year and the weather).  3 receiving blankets.  3 baby pajamas or gowns.  3 bibs. Bath equipment:  Mild soap.  Petroleum jelly. No baby oil or powder.  Soft cloth towel and wash cloth.  Cotton balls.  Separate bath basin for baby. Only sponge bathe until umbilical cord and circumcision are healed. Other supplies:  Thermometer and bulb syringe (ask the hospital to send them home with you). Ask your doctor about how you should take your baby's temperature.  One to two pacifiers. Prepare for an emergency:  Know how to get to the hospital and know where to admit your baby.  Put all doctor numbers near your house phone and in your cell phone if you have one. Prepare your family:  Talk with siblings about the baby coming home and how they feel about it.  Decide how you want to handle visitors and other family members.  Take offers for help with the baby. You will need time to adjust. Know when to call the doctor.   GET HELP RIGHT AWAY IF:  Your baby's temperature is greater than 100.4 F (38 C).  The softspot on your baby's head starts to bulge.  Your baby is crying with no tears or has no wet diapers for 6 hours.  Your baby has rapid breathing.  Your baby is not as alert. Document Released: 08/13/2008 Document Revised: 11/23/2011 Document Reviewed: 11/20/2010 [facility] Patient Information 2015 [place], [place]. This information is not intended to replace advice given to you by your health care provider. Make sure you discuss any questions you have with your health care provider.

## 2014-03-02 NOTE — Lactation Note (Signed)
This note was copied from the chart of Jacqueline Yareli Mohmed. Lactation Consultation Note  Patient Name: Jacqueline Osborn Cohomal Mohmed QIONG'EToday's Date: 03/02/2014 Reason for consult: Follow-up assessment  Baby 43 hours of life. Mom reports BF going very well. Mom an experienced breastfeeder. Discussed engorgement prevention/treatment. Mom aware of OP/BFSG and community resources.  Maternal Data    Feeding Feeding Type:  (Baby content in mom's arms, mom states BF going very well. )  LATCH Score/Interventions                      Lactation Tools Discussed/Used     Consult Status      Geralynn OchsWILLIARD, JENNIFER 03/02/2014, 10:24 AM

## 2014-03-02 NOTE — Progress Notes (Signed)
Post Partum Day 2 Subjective: no complaints  Objective: Blood pressure 100/61, pulse 69, temperature 98 F (36.7 C), temperature source Oral, resp. rate 18, height 5\' 4"  (1.626 m), weight 162 lb (73.483 kg), last menstrual period 05/30/2013, SpO2 100.00%, unknown if currently breastfeeding.  Physical Exam:  General: alert and no distress Lochia: appropriate Uterine Fundus: firm Incision: none DVT Evaluation: No evidence of DVT seen on physical exam.   Recent Labs  02/28/14 0745  HGB 11.7*  HCT 34.9*    Assessment/Plan: Discharge home   LOS: 2 days   HARPER,CHARLES A 03/02/2014, 5:44 AM

## 2014-03-02 NOTE — Discharge Summary (Signed)
Obstetric Discharge Summary Reason for Admission: induction of labor Prenatal Procedures: ultrasound Intrapartum Procedures: spontaneous vaginal delivery Postpartum Procedures: none Complications-Operative and Postpartum: none Hemoglobin  Date Value Ref Range Status  02/28/2014 11.7* 12.0 - 15.0 g/dL Final     HCT  Date Value Ref Range Status  02/28/2014 34.9* 36.0 - 46.0 % Final    Physical Exam:  General: alert and no distress Lochia: appropriate Uterine Fundus: firm Incision: none DVT Evaluation: No evidence of DVT seen on physical exam.  Discharge Diagnoses: Term Pregnancy-delivered  Discharge Information: Date: 03/02/2014 Activity: pelvic rest Diet: routine Medications: PNV, Ibuprofen, Colace and Percocet Condition: stable Instructions: refer to practice specific booklet Discharge to: home Follow-up Information   Follow up with Antionette CharJACKSON-MOORE,[name], MD. Schedule an appointment as soon as possible for a visit in 2 weeks.   Specialty:  Obstetrics and Gynecology   Contact information:   [address] [place] [id number] [phone]       Newborn Data: Live born female  Birth Weight: 7 lb 14.1 oz (3575 g) APGAR: 9, 9  Home with mother.  HARPER,CHARLES A 03/02/2014, 5:48 AM

## 2014-03-08 ENCOUNTER — Inpatient Hospital Stay (HOSPITAL_COMMUNITY): Admission: AD | Admit: 2014-03-08 | Payer: Medicaid Other | Source: Ambulatory Visit | Admitting: Obstetrics

## 2014-07-16 ENCOUNTER — Encounter (HOSPITAL_COMMUNITY): Payer: Self-pay

## 2014-07-16 ENCOUNTER — Encounter (HOSPITAL_BASED_OUTPATIENT_CLINIC_OR_DEPARTMENT_OTHER): Payer: Self-pay | Admitting: Emergency Medicine

## 2014-09-10 ENCOUNTER — Encounter: Payer: Self-pay | Admitting: *Deleted

## 2014-09-11 ENCOUNTER — Encounter: Payer: Self-pay | Admitting: Obstetrics & Gynecology

## 2016-02-19 ENCOUNTER — Telehealth: Payer: Self-pay

## 2016-02-19 NOTE — Telephone Encounter (Signed)
Called patient to sch appt and spoke with spouse - note on referral that she wanted to see Dr.[name],  Told him she is no longer here - stated would have patient call us back

## 2016-04-02 ENCOUNTER — Encounter: Payer: Self-pay | Admitting: Certified Nurse Midwife

## 2016-04-02 ENCOUNTER — Ambulatory Visit (INDEPENDENT_AMBULATORY_CARE_PROVIDER_SITE_OTHER): Payer: Medicaid Other | Admitting: Certified Nurse Midwife

## 2016-04-02 VITALS — BP 100/67 | HR 95 | Temp 98.8°F | Wt 158.2 lb

## 2016-04-02 DIAGNOSIS — Z3482 Encounter for supervision of other normal pregnancy, second trimester: Secondary | ICD-10-CM

## 2016-04-02 DIAGNOSIS — Z331 Pregnant state, incidental: Secondary | ICD-10-CM | POA: Diagnosis not present

## 2016-04-02 DIAGNOSIS — O09522 Supervision of elderly multigravida, second trimester: Secondary | ICD-10-CM

## 2016-04-02 DIAGNOSIS — O99820 Streptococcus B carrier state complicating pregnancy: Secondary | ICD-10-CM | POA: Insufficient documentation

## 2016-04-02 DIAGNOSIS — O0932 Supervision of pregnancy with insufficient antenatal care, second trimester: Secondary | ICD-10-CM | POA: Diagnosis not present

## 2016-04-02 DIAGNOSIS — Z3201 Encounter for pregnancy test, result positive: Secondary | ICD-10-CM

## 2016-04-02 DIAGNOSIS — Z1389 Encounter for screening for other disorder: Secondary | ICD-10-CM

## 2016-04-02 DIAGNOSIS — O0992 Supervision of high risk pregnancy, unspecified, second trimester: Secondary | ICD-10-CM | POA: Diagnosis not present

## 2016-04-02 LAB — POCT URINALYSIS DIPSTICK
Bilirubin, UA: NEGATIVE
Blood, UA: 50
GLUCOSE UA: NEGATIVE
Ketones, UA: NEGATIVE
LEUKOCYTES UA: NEGATIVE
Nitrite, UA: NEGATIVE
PROTEIN UA: NEGATIVE
SPEC GRAV UA: 1.015
UROBILINOGEN UA: NEGATIVE
pH, UA: 8

## 2016-04-02 LAB — POCT URINE PREGNANCY: PREG TEST UR: POSITIVE — AB

## 2016-04-02 MED ORDER — PRENATE PIXIE 10-0.6-0.4-200 MG PO CAPS: 1.0000 | ORAL_CAPSULE | Freq: Every day | ORAL | Status: DC

## 2016-04-02 NOTE — Progress Notes (Signed)
Subjective:    Jacqueline [name] is being seen today for her first obstetrical visit.  This is not a planned pregnancy. She is at [redacted]w[redacted]d gestation. Her obstetrical history is significant for advanced maternal age. Relationship with FOB: spouse, living together. Patient does intend to breast feed. Pregnancy history fully reviewed.  Denies any cancer history.  Patients reports constipation.  Requesting stool softener.  Patient does not want to know sex of baby.  Patient reports 1st child being delivered in [place].  All other children delivered in the [place].  The information documented in the HPI was reviewed and verified.  Menstrual History: OB History    Gravida Para Term Preterm AB TAB SAB Ectopic Multiple Living   5 3 3  0 0 0 0 0 0 4      Menarche age: 61  Patient's last menstrual period was 12/19/2015.    Past Medical History  Diagnosis Date  . No pertinent past medical history     Past Surgical History  Procedure Laterality Date  . No past surgeries       (Not in a hospital admission) No Known Allergies  Social History  Substance Use Topics  . Smoking status: Never Smoker   . Smokeless tobacco: Never Used  . Alcohol Use: No    Family History  Problem Relation Age of Onset  . Diabetes Father   . Hypertension Father      Review of Systems Constitutional: negative for weight loss Gastrointestinal: negative for vomiting, positive constipation and occasional heartburn Genitourinary:negative for genital lesions and vaginal discharge and dysuria Musculoskeletal:negative for back pain Behavioral/Psych: negative for abusive relationship, depression, illegal drug usage and tobacco use    Objective:    BP 100/67 mmHg  Pulse 95  Temp(Src) 98.8 F (37.1 C)  Wt 158 lb 3.2 oz (71.759 kg)  LMP 12/19/2015 General Appearance:    Alert, cooperative, no distress, appears stated age  Head:    Normocephalic, without obvious abnormality, atraumatic  Eyes:    PERRL,  conjunctiva/corneas clear, EOM's intact, fundi    benign, both eyes  Ears:    Normal TM's and external ear canals, both ears  Nose:   Nares normal, septum midline, mucosa normal, no drainage    or sinus tenderness  Throat:   Lips, mucosa, and tongue normal; teeth and gums normal  Neck:   Supple, symmetrical, trachea midline, no adenopathy;    thyroid:  no enlargement/tenderness/nodules; no carotid   bruit or JVD  Back:     Symmetric, no curvature, ROM normal, no CVA tenderness  Lungs:     Clear to auscultation bilaterally, respirations unlabored  Chest Wall:    No tenderness or deformity   Heart:    Regular rate and rhythm, S1 and S2 normal, no murmur, rub   or gallop  Breast Exam:    No tenderness, masses, or nipple abnormality  Abdomen:     Soft, non-tender, bowel sounds active all four quadrants,    no masses, no organomegaly  Genitalia:    Normal female without lesion, discharge or tenderness  Extremities:   Extremities normal, atraumatic, no cyanosis or edema  Pulses:   2+ and symmetric all extremities  Skin:   Skin color, texture, turgor normal, no rashes or lesions  Lymph nodes:   Cervical, supraclavicular, and axillary nodes normal  Neurologic:   CNII-XII intact, normal strength, sensation and reflexes    throughout    FHR: 130 Fundal height: below U  Lab Review Urine pregnancy  test Labs reviewed no Radiologic studies reviewed no Assessment:    Pregnancy at 7054w0d weeks   Advanced maternal age. Plan:      Prenatal vitamins.  Counseling provided regarding continued use of seat belts, cessation of alcohol consumption, smoking or use of illicit drugs; infection precautions i.e., influenza/TDAP immunizations, toxoplasmosis,CMV, parvovirus, listeria and varicella; workplace safety, exercise during pregnancy; routine dental care, safe medications, sexual activity, hot tubs, saunas, pools, travel, caffeine use, fish and methlymercury, potential toxins, hair treatments, varicose  veins Weight gain recommendations per IOM guidelines reviewed: underweight/BMI< 18.5--> gain 28 - 40 lbs; normal weight/BMI 18.5 - 24.9--> gain 25 - 35 lbs; overweight/BMI 25 - 29.9--> gain 15 - 25 lbs; obese/BMI >30->gain  11 - 20 lbs Problem list reviewed and updated. FIRST/CF mutation testing/NIPT/QUAD SCREEN/fragile X/Ashkenazi Jewish population testing/Spinal muscular atrophy discussed: ordered. Role of ultrasound in pregnancy discussed; fetal survey: ordered. Amniocentesis discussed: not indicated. VBAC calculator score: VBAC consent form provided Meds ordered this encounter  Medications  . Prenat-FeAsp-Meth-FA-DHA w/o A (PRENATE PIXIE) 10-0.6-0.4-200 MG CAPS    Sig: Take 1 tablet by mouth daily.    Dispense:  30 capsule    Refill:  12    Please process coupon: Rx BIN: [id number], RxPCN: [id number], RxGRP: [id number]: [id number], Rx: [id number]: [id number]  SUF: 01   Orders Placed This Encounter  Procedures  . Culture, OB Urine  . US MFM OB COMP + 14 WK    Standing Status: Future     Number of Occurrences:      Standing Expiration Date: 06/03/2017    Order Specific Question:  Reason for Exam (SYMPTOM  OR DIAGNOSIS REQUIRED)    Answer:  AMA    Order Specific Question:  Preferred imaging location?    Answer:  [facility]  . HIV antibody  . Hemoglobinopathy evaluation  . Varicella zoster antibody, IgG  . VITAMIN D 25 Hydroxy (Vit-D Deficiency, Fractures)  . Prenatal Profile I  . MaterniT Genome    Order Specific Question:  Is the patient insulin dependent?    Answer:  No    Order Specific Question:  Please enter gestational age. This should be expressed as weeks AND days, i.e. 16w 6d. Enter weeks here. Enter days in next question.    Answer:  5315    Order Specific Question:  Please enter gestational age. This should be expressed as weeks AND days, i.e. 16w 6d. Enter days here. Enter weeks in previous question.    Answer:  0    Order Specific Question:  How was gestational age calculated?    Answer:  LMP     Order Specific Question:  Please give the date of LMP OR Ultrasound OR Estimated date of delivery.    Answer:  09/24/2016    Order Specific Question:  Number of Fetuses (Type of Pregnancy):    Answer:  1    Order Specific Question:  Indications for performing the test? (please choose all that apply):    Answer:  Routine screening    Order Specific Question:  Other Indications? (Y=Yes, N=No)    Answer:  Y    Order Specific Question:  Please specify other indications, if any:    Answer:  AMA    Order Specific Question:  If this is a repeat specimen, please indicate the reason:    Answer:  Not indicated    Order Specific Question:  Please specify the patient's race: (C=White/Caucasion, B=Black, I=Native American, A=Asian, H=Hispanic, O=Other, U=Unknown)    Answer:  [place]  Order Specific Question:  Donor Egg - indicate if the egg was obtained from in vitro fertilization.    Answer:  N    Order Specific Question:  Age of Egg Donor.    Answer:  61    Order Specific Question:  Prior Down Syndrome/ONTD screening during current pregnancy.    Answer:  N    Order Specific Question:  Prior First Trimester Testing    Answer:  N    Order Specific Question:  Prior Second Trimester Testing    Answer:  N    Order Specific Question:  Family History of Neural Tube Defects    Answer:  N    Order Specific Question:  Prior Pregnancy with Down Syndrome    Answer:  N    Order Specific Question:  Please give the patient's weight (in pounds)    Answer:  158  . AMB referral to maternal fetal medicine    Referral Priority:  Routine    Referral Type:  Consultation    Referral Reason:  Specialty Services Required    Number of Visits Requested:  1  . POCT urinalysis dipstick  . POCT urine pregnancy   Follow up with MFM, d/t AMA, for U/S Follow up in 4 weeks. 50% of 30 min visit spent on counseling and coordination of care.

## 2016-04-02 NOTE — Progress Notes (Signed)
I agree with note by NP Student Peri MarisAndrew Brake.  Was present for exam.  R.Kynesha Guerin CNM  Cervix: long, thick, closed and posterior.

## 2016-04-05 LAB — CULTURE, OB URINE

## 2016-04-05 LAB — URINE CULTURE, OB REFLEX

## 2016-04-06 LAB — PAP IG AND HPV HIGH-RISK
HPV, high-risk: NEGATIVE
PAP Smear Comment: 0

## 2016-04-07 LAB — NUSWAB VG+, CANDIDA 6SP
CANDIDA KRUSEI, NAA: NEGATIVE
CANDIDA TROPICALIS, NAA: NEGATIVE
CHLAMYDIA TRACHOMATIS, NAA: NEGATIVE
Candida albicans, NAA: NEGATIVE
Candida glabrata, NAA: NEGATIVE
Candida lusitaniae, NAA: NEGATIVE
Candida parapsilosis, NAA: NEGATIVE
NEISSERIA GONORRHOEAE, NAA: NEGATIVE
TRICH VAG BY NAA: NEGATIVE

## 2016-04-10 LAB — PRENATAL PROFILE I(LABCORP)
Antibody Screen: NEGATIVE
BASOS ABS: 0 10*3/uL (ref 0.0–0.2)
Basos: 0 %
EOS (ABSOLUTE): 0 10*3/uL (ref 0.0–0.4)
Eos: 0 %
HEMOGLOBIN: 11.5 g/dL (ref 11.1–15.9)
Hematocrit: 35.3 % (ref 34.0–46.6)
Hepatitis B Surface Ag: NEGATIVE
IMMATURE GRANULOCYTES: 0 %
Immature Grans (Abs): 0 10*3/uL (ref 0.0–0.1)
LYMPHS ABS: 1.2 10*3/uL (ref 0.7–3.1)
LYMPHS: 14 %
MCH: 27.5 pg (ref 26.6–33.0)
MCHC: 32.6 g/dL (ref 31.5–35.7)
MCV: 84 fL (ref 79–97)
MONOS ABS: 0.3 10*3/uL (ref 0.1–0.9)
Monocytes: 4 %
NEUTROS PCT: 82 %
Neutrophils Absolute: 6.8 10*3/uL (ref 1.4–7.0)
PLATELETS: 233 10*3/uL (ref 150–379)
RBC: 4.18 x10E6/uL (ref 3.77–5.28)
RDW: 15.3 % (ref 12.3–15.4)
RPR Ser Ql: NONREACTIVE
Rh Factor: POSITIVE
Rubella Antibodies, IGG: 11.3 index (ref >0.99)
WBC: 8.4 10*3/uL (ref 3.4–10.8)

## 2016-04-10 LAB — HEMOGLOBINOPATHY EVALUATION
HEMOGLOBIN A2 QUANTITATION: 2.7 % (ref 0.7–3.1)
HEMOGLOBIN F QUANTITATION: 0.5 % (ref 0.0–2.0)
HGB A: 96.8 % (ref 94.0–98.0)
HGB C: 0 %
HGB S: 0 %

## 2016-04-10 LAB — VITAMIN D 25 HYDROXY (VIT D DEFICIENCY, FRACTURES): Vit D, 25-Hydroxy: 16.3 ng/mL — ABNORMAL LOW (ref 30.0–100.0)

## 2016-04-10 LAB — MATERNIT GENOME: PDF: 0

## 2016-04-10 LAB — VARICELLA ZOSTER ANTIBODY, IGG: Varicella zoster IgG: 1946 index (ref >165)

## 2016-04-10 LAB — HIV ANTIBODY (ROUTINE TESTING W REFLEX): HIV Screen 4th Generation wRfx: NONREACTIVE

## 2016-04-23 ENCOUNTER — Other Ambulatory Visit: Payer: Self-pay | Admitting: Certified Nurse Midwife

## 2016-04-24 ENCOUNTER — Encounter (HOSPITAL_COMMUNITY): Payer: Self-pay | Admitting: Certified Nurse Midwife

## 2016-04-29 ENCOUNTER — Encounter (HOSPITAL_COMMUNITY): Payer: Self-pay

## 2016-04-30 ENCOUNTER — Encounter: Payer: Medicaid Other | Admitting: Certified Nurse Midwife

## 2016-04-30 ENCOUNTER — Other Ambulatory Visit: Payer: Self-pay | Admitting: Certified Nurse Midwife

## 2016-04-30 ENCOUNTER — Encounter (HOSPITAL_COMMUNITY): Payer: Self-pay

## 2016-04-30 ENCOUNTER — Ambulatory Visit (HOSPITAL_COMMUNITY)
Admission: RE | Admit: 2016-04-30 | Discharge: 2016-04-30 | Disposition: A | Discharge status: Home / Self Care | Payer: Medicaid Other | Source: Ambulatory Visit | Attending: Certified Nurse Midwife | Admitting: Certified Nurse Midwife

## 2016-04-30 VITALS — BP 109/61 | HR 83 | Wt 163.0 lb

## 2016-04-30 DIAGNOSIS — O09522 Supervision of elderly multigravida, second trimester: Secondary | ICD-10-CM | POA: Diagnosis not present

## 2016-04-30 DIAGNOSIS — O0932 Supervision of pregnancy with insufficient antenatal care, second trimester: Secondary | ICD-10-CM

## 2016-04-30 DIAGNOSIS — Z1389 Encounter for screening for other disorder: Secondary | ICD-10-CM

## 2016-04-30 DIAGNOSIS — Z3A19 19 weeks gestation of pregnancy: Secondary | ICD-10-CM | POA: Insufficient documentation

## 2016-04-30 DIAGNOSIS — Z3482 Encounter for supervision of other normal pregnancy, second trimester: Secondary | ICD-10-CM

## 2016-04-30 DIAGNOSIS — Z843 Family history of consanguinity: Secondary | ICD-10-CM

## 2016-04-30 DIAGNOSIS — O09529 Supervision of elderly multigravida, unspecified trimester: Secondary | ICD-10-CM

## 2016-04-30 DIAGNOSIS — Z315 Encounter for genetic counseling: Secondary | ICD-10-CM | POA: Diagnosis present

## 2016-04-30 NOTE — Progress Notes (Signed)
Genetic Counseling  High-Risk Gestation Note  Appointment Date:  04/30/2016 Referred By: Jacqueline [name], CNM Date of Birth:  14-Jan-1976 Partner:  Jacqueline [name]   Pregnancy History: [id number]: G5P4004 Estimated Date of Delivery: 09/24/16 Estimated Gestational Age: 3346w0d Attending: Particia NearingMartha Decker, MD   Jacqueline [name]. Jacqueline [name] was seen for genetic counseling because of [name] maternal age of 40 y.o..     In summary:  Discussed increased risk for fetal aneuploidy with advanced maternal age  Reviewed results of patient's ultrasound today, which was within normal limits  NIPS (MaterniTGenome) [facility]; Results not available at time of patient's visit  Records received after patient's visit documented NIPS was within normal range  Discussed limitations of screening and option of diagnostic testing  Declined amniocentesis  Reviewed family history concerns  Multiple relatives with congenital hearing loss-specific etiology not known  Patient and partner are second cousins  Discussed option of expanded carrier screening for autosomal recessive conditions-declined today  Discussed carrier screening options  CF-declined  SMA-declined  Hemoglobinopathies-within normal range through OB office  She was counseled regarding maternal age and the association with risk for chromosome conditions due to nondisjunction with aging of the ova.   We reviewed chromosomes, nondisjunction, and the associated 1 in 5128 risk for fetal aneuploidy related to [name] maternal age of 40 y.o. at 3346w0d gestation.  She was were counseled that the risk for aneuploidy decreases as gestational age increases, accounting for those pregnancies which spontaneously abort.  We specifically discussed Down syndrome (trisomy 2921), trisomies 6513 and 818, and sex chromosome aneuploidies (47,XXX and 47,XXY) including the common features and prognoses of each.   Mrs. Jacqueline [name] previously had noninvasive prenatal screening  (NIPS) through her OB office. The lab report was not available to us at the time of Jacqueline [name]'s visit and was received after the patient completed her visit today. We discussed that NIPS analyzes placental cell free DNA in maternal circulation to evaluate for the presence of extra chromosome conditions.  Thus, it is able to provide risk assessment for specific chromosome conditions, but is not diagnostic.  Specifically, Jacqueline [name] had MaterniTGenome.  Based on this test, the chance for her baby to have aneuploidy for chromosomes 13, 18, 21, 16, 22, X, and Y are low risk.  We reviewed sensitivity and false positive rates. She understands NIPS is [name] screen and is not considered diagnostic.   She was counseled that 50-80% of fetuses with Down syndrome and up to 90% of fetuses with trisomies 13 and 18, when well visualized, have detectable anomalies or soft markers by ultrasound.  Complete ultrasound was performed today and was within normal limits. Complete ultrasound report under separate cover.   We also discussed the availability of diagnostic testing by way of amniocentesis.  We reviewed the risks, benefits and limitations of amniocentesis including the approximate 1 in 300-500 risk for pregnancy complications following amniocentesis. We discussed the possible results that the tests might provide including: positive, negative, unanticipated, and no result. Finally, they were counseled regarding the cost of each option and potential out of pocket expenses.  After reviewing the above results and the available options, Jacqueline [name] expressed that she is not interested in pursuing diagnostic testing at this time or in the future, given the associated risk of complications.  She understands that ultrasound and NIPS cannot rule out all birth defects or genetic syndromes.    Both family histories were reviewed and found to be contributory for consanguinity and congenital hearing  loss. The patient reported  that she and the father of the pregnancy are second cousins; her paternal grandfather and his maternal grandmother were siblings. Jacqueline [name] reported that two of her father's brothers have congenital partial hearing loss and loss of night vision. They are both currently in their 71s. She reported one additional paternal uncle with hearing loss in his 56s. The father of the pregnancy has [name] niece, age 68 years and nephew, age 80 years (siblings to each other) from one of his brothers with early onset partial hearing loss.  The parents of these individuals are also consanguineous. The father of the pregnancy reported two maternal uncles with hearing loss in their 12s. No additional relatives were reported with hearing loss.   Jacqueline [name] had genetic counseling in [name] previous pregnancy given the presence of consanguinity. We discussed that children born to [name] consanguineous couple are at increased risk for genetic health problems.  This increase in risk is related to the possibility of passing on recessive genes. We explained that every person carries approximately 7-10 non-working genes that when received in [name] double dose results in recessive genetic conditions.  In general, unrelated couples have [name] relatively low risk of having [name] child with [name] recessive condition because the likelihood of both parents carrying the same non-working recessive gene is very low.  However, when [name] couple is related, they have inherited some of their genetic information from the same family member, which leads to an increased chance that they may carry the same recessive gene and have [name] child with [name] recessive condition.  For second cousin unions, the risk to have [name] child with [name] birth defect, mental retardation, or genetic condition is increased approximately 1% above the general population risk (3-5%).    We reviewed the hearing loss can be categorized as either prelingual (occurring prior to the development of speech) or postlingual  (occurring after the development of normal speech). Prelingual hearing loss includes congenital hearing loss but does not necessarily have to be congenital. Additionally, hearing loss is typically categorized as either syndromic or nonsyndromic. Approximately 1 in 500 newborns has sensorineural hearing loss. Sensorineural hearing loss is due to disorders of the inner ear. Congenital hearing loss can be hereditary or nonhereditary. It is estimated that for prelingual hearing loss, approximately 25% is idiopathic; approximately 25% is non-genetic; and approximately 50% or greater is genetic. Nonhereditary causes of prelingual hearing loss include teratogenic exposures in pregnancy, such as congenital infection or medications, or postnatal exposures, such as infections or medication use. Regarding genetic causes of prelingual hearing loss, approximately 30% is syndromic(including over 400 genetic syndromes), and approximately 70% is nonsyndromic. There are numerous forms of both syndromic and nonsyndromic hereditary hearing loss following various patterns of inheritance including autosomal recessive, autosomal dominant, X-linked, and mitochondrial inheritance. We discussed that the reported family history is most suggestive of underlying autosomal recessive hearing loss. It cannot be determined based on the reported information if there is [name] single or multiple etiologies for the hearing loss in the family.   The majority of hereditary prelingual hearing loss is nonsyndromic and follows autosomal recessive inheritance. Approximately 50% of hereditary nonsyndromic hearing loss is attributed to DFNB1, which is caused by mutations in GJB2 (which encode protein connexin 26) and GJB6 (which encodes protein connexin 30). We discussed that in autosomal recessive inheritance, an individual typically has the particular disorder when both copies of [name] particular gene pair have [name] nonworking change. This is typically only  inherited when  both parents are at least carriers.  [name] carrier refers to an individual with one nonworking copy of the gene and one working copy of the gene pair. Each pregnancy of [name] carrier couple for [name] particular autosomal recessive condition has [name] 1 in 4 (25%) chance to inherit the condition. All offspring of an individual with an autosomal recessive condition would be obligate carriers. The carrier frequency for the most common cause of autosomal recessive nonsyndromic hearing loss, GJB2, is approximately 1 in 5633. Autosomal dominant inheritance describes one nonworking copy of [name] particular gene pair leading to [name] particular disorder, and recurrence risk for each offspring for an affected individual would be 1 in 2 (50%). X-linked inheritance typically describes females carrying [name] nonworking gene change on the X chromosome, typically mildly symptomatic to asymptomatic. Carrier females would have [name] 1 in 4 chance for each of the following with each pregnancy: [name] female who inherits the nonworking gene and is [name] carrier, [name] female who is neither [name] carrier nor affected, [name] female who is unaffected, and [name] female who inherits the nonworking gene and is affected. Males with an X-linked condition would be expected to have either daughters who are carriers or sons who are unaffected. Less commonly, mitochondrial genetic changes, which would be maternally inherited, can cause nonsyndromic hearing loss.  Jacqueline [name]. Jacqueline Rampmal Bryngelson understands that without knowing the specific etiology for hearing loss for these relatives, exact recurrence risk for hearing loss in the current pregnancy cannot be determined. However, we discussed [name] range of recurrence risk estimates based on the available information. In the case of an underlying autosomal recessive form, Jacqueline [name] would have [name] 1 in 3 risk to be [name] carrier, and her husband would have an approximate 1 in 8 risk to be [name] carrier for the same condition, meaning recurrence risk may be 1 in 1096.  However, we discussed that additional information regarding the etiology/etiologies of the hearing loss in the family is needed to accurately determine if it is [name] genetic cause and the recurrence risk based on inheritance pattern, if so. We discussed the option of carrier screening for autosomal recessive forms of hearing loss (in addition to [name] range of additional autosomal recessive and X-linked conditions) via expanded carrier screening. Jacqueline [name] declined expanded carrier screening today.    The family histories were otherwise found to be noncontributory for birth defects, intellectual disability, and known genetic conditions. Without further information regarding the provided family history, an accurate genetic risk cannot be calculated. Further genetic counseling is warranted if more information is obtained.   Jacqueline [name]. Jacqueline Rampmal Mangine was provided with written information regarding cystic fibrosis (CF), spinal muscular atrophy (SMA) and hemoglobinopathies including the carrier frequency, availability of carrier screening and prenatal diagnosis if indicated.  In addition, we discussed that CF and hemoglobinopathies are routinely screened for as part of the [place] newborn screening panel.  After further discussion, she declined screening for CF and SMA. Hemoglobin electrophoresis was previously performed and within normal range.   Jacqueline [name]. Jacqueline Rampmal Emmerich denied exposure to environmental toxins or chemical agents. She denied the use of alcohol, tobacco or street drugs. She denied significant viral illnesses during the course of her pregnancy. Her medical and surgical histories were noncontributory.   I counseled Jacqueline [name]. [facility] regarding the above risks and available options.  The approximate face-to-face time with the genetic counselor was 45 minutes.  Quinn PlowmanKaren Haygen Zebrowski, Jacqueline [name],  Certified Genetic Counselor 04/30/2016

## 2016-05-01 ENCOUNTER — Other Ambulatory Visit: Payer: Self-pay | Admitting: Certified Nurse Midwife

## 2016-05-01 DIAGNOSIS — O09522 Supervision of elderly multigravida, second trimester: Secondary | ICD-10-CM

## 2016-05-04 ENCOUNTER — Other Ambulatory Visit (HOSPITAL_COMMUNITY): Payer: Self-pay

## 2016-05-13 ENCOUNTER — Encounter: Payer: Self-pay | Admitting: Certified Nurse Midwife

## 2016-05-13 ENCOUNTER — Ambulatory Visit (INDEPENDENT_AMBULATORY_CARE_PROVIDER_SITE_OTHER): Payer: Medicaid Other | Admitting: Certified Nurse Midwife

## 2016-05-13 VITALS — BP 100/63 | HR 80 | Temp 99.0°F | Wt 163.4 lb

## 2016-05-13 DIAGNOSIS — Z331 Pregnant state, incidental: Secondary | ICD-10-CM | POA: Diagnosis not present

## 2016-05-13 DIAGNOSIS — O09522 Supervision of elderly multigravida, second trimester: Secondary | ICD-10-CM | POA: Diagnosis not present

## 2016-05-13 DIAGNOSIS — Z3492 Encounter for supervision of normal pregnancy, unspecified, second trimester: Secondary | ICD-10-CM | POA: Diagnosis not present

## 2016-05-13 DIAGNOSIS — Z1389 Encounter for screening for other disorder: Secondary | ICD-10-CM | POA: Diagnosis not present

## 2016-05-13 DIAGNOSIS — O99612 Diseases of the digestive system complicating pregnancy, second trimester: Secondary | ICD-10-CM | POA: Diagnosis not present

## 2016-05-13 DIAGNOSIS — K219 Gastro-esophageal reflux disease without esophagitis: Secondary | ICD-10-CM

## 2016-05-13 LAB — POCT URINALYSIS DIPSTICK
Bilirubin, UA: NEGATIVE
GLUCOSE UA: NEGATIVE
KETONES UA: NEGATIVE
Leukocytes, UA: NEGATIVE
Nitrite, UA: NEGATIVE
Protein, UA: NEGATIVE
RBC UA: 250
SPEC GRAV UA: 1.02
Urobilinogen, UA: NEGATIVE
pH, UA: 6

## 2016-05-13 MED ORDER — OMEPRAZOLE 20 MG PO CPDR: 20.0000 mg | DELAYED_RELEASE_CAPSULE | Freq: Two times a day (BID) | ORAL | 5 refills | Status: DC

## 2016-05-13 NOTE — Progress Notes (Signed)
Patient c/o heart burn.

## 2016-05-13 NOTE — Addendum Note (Signed)
Addended by: Marya LandryFOSTER, SUZANNE D on: 05/13/2016 04:41 PM   Modules accepted: Orders

## 2016-05-13 NOTE — Patient Instructions (Addendum)
Food Choices for Gastroesophageal Reflux Disease, Adult When you have gastroesophageal reflux disease (GERD), the foods you eat and your eating habits are very important. Choosing the right foods can help ease the discomfort of GERD. WHAT GENERAL GUIDELINES DO I NEED TO FOLLOW?  Choose fruits, vegetables, whole grains, low-fat dairy products, and low-fat meat, fish, and poultry.  Limit fats such as oils, salad dressings, butter, nuts, and avocado.  Keep a food diary to identify foods that cause symptoms.  Avoid foods that cause reflux. These may be different for different people.  Eat frequent small meals instead of three large meals each day.  Eat your meals slowly, in a relaxed setting.  Limit fried foods.  Cook foods using methods other than frying.  Avoid drinking alcohol.  Avoid drinking large amounts of liquids with your meals.  Avoid bending over or lying down until 2-3 hours after eating. WHAT FOODS ARE NOT RECOMMENDED? The following are some foods and drinks that may worsen your symptoms: Vegetables Tomatoes. Tomato juice. Tomato and spaghetti sauce. Chili peppers. Onion and garlic. Horseradish. Fruits Oranges, grapefruit, and lemon (fruit and juice). Meats High-fat meats, fish, and poultry. This includes hot dogs, ribs, ham, sausage, salami, and bacon. Dairy Whole milk and chocolate milk. Sour cream. Cream. Butter. Ice cream. Cream cheese.  Beverages Coffee and tea, with or without caffeine. Carbonated beverages or energy drinks. Condiments Hot sauce. Barbecue sauce.  Sweets/Desserts Chocolate and cocoa. Donuts. Peppermint and spearmint. Fats and Oils High-fat foods, including [place] fries and potato chips. Other Vinegar. Strong spices, such as black pepper, white pepper, red pepper, cayenne, curry powder, cloves, ginger, and chili powder. The items listed above may not be a complete list of foods and beverages to avoid. Contact your dietitian for more  information.   This information is not intended to replace advice given to you by your health care provider. Make sure you discuss any questions you have with your health care provider.   Document Released: 08/31/2005 Document Revised: 09/21/2014 Document Reviewed: 07/05/2013 Elsevier Interactive Patient Education 2016 [employer].  Gastroesophageal Reflux Disease, Adult Normally, food travels down the esophagus and stays in the stomach to be digested. If a person has gastroesophageal reflux disease (GERD), food and stomach acid move back up into the esophagus. When this happens, the esophagus becomes sore and swollen (inflamed). Over time, GERD can make small holes (ulcers) in the lining of the esophagus. HOME CARE Diet  Follow a diet as told by your doctor. You may need to avoid foods and drinks such as:  Coffee and tea (with or without caffeine).  Drinks that contain alcohol.  Energy drinks and sports drinks.  Carbonated drinks or sodas.  Chocolate and cocoa.  Peppermint and mint flavorings.  Garlic and onions.  Horseradish.  Spicy and acidic foods, such as peppers, chili powder, curry powder, vinegar, hot sauces, and BBQ sauce.  Citrus fruit juices and citrus fruits, such as oranges, lemons, and limes.  Tomato-based foods, such as red sauce, chili, salsa, and pizza with red sauce.  Fried and fatty foods, such as donuts, french fries, potato chips, and high-fat dressings.  High-fat meats, such as hot dogs, rib eye steak, sausage, ham, and bacon.  High-fat dairy items, such as whole milk, butter, and cream cheese.  Eat small meals often. Avoid eating large meals.  Avoid drinking large amounts of liquid with your meals.  Avoid eating meals during the 2-3 hours before bedtime.  Avoid lying down right after you eat.  Do not exercise right after you eat. General Instructions  Pay attention to any changes in your symptoms.  Take over-the-counter and  prescription medicines only as told by your doctor. Do not take aspirin, ibuprofen, or other NSAIDs unless your doctor says it is okay.  Do not use any tobacco products, including cigarettes, chewing tobacco, and e-cigarettes. If you need help quitting, ask your doctor.  Wear loose clothes. Do not wear anything tight around your waist.  Raise (elevate) the head of your bed about 6 inches (15 cm).  Try to lower your stress. If you need help doing this, ask your doctor.  If you are overweight, lose an amount of weight that is healthy for you. Ask your doctor about a safe weight loss goal.  Keep all follow-up visits as told by your doctor. This is important. GET HELP IF:  You have new symptoms.  You lose weight and you do not know why it is happening.  You have trouble swallowing, or it hurts to swallow.  You have wheezing or a cough that keeps happening.  Your symptoms do not get better with treatment.  You have a hoarse voice. GET HELP RIGHT AWAY IF:  You have pain in your arms, neck, jaw, teeth, or back.  You feel sweaty, dizzy, or light-headed.  You have chest pain or shortness of breath.  You throw up (vomit) and your throw up looks like blood or coffee grounds.  You pass out (faint).  Your poop (stool) is bloody or black.  You cannot swallow, drink, or eat.   This information is not intended to replace advice given to you by your health care provider. Make sure you discuss any questions you have with your health care provider.   Document Released: 02/17/2008 Document Revised: 05/22/2015 Document Reviewed: 12/26/2014 Elsevier Interactive Patient Education 2016 [employer] Inc[employer]  Heartburn During Pregnancy  Heartburn happens when stomach acid goes up into the esophagus. The esophagus is the tube between the mouth and the stomach. This acid causes a burning pain in the chest or throat. This happens more often in the later part of pregnancy because the womb (uterus) gets  larger. It may also happen because of hormone changes. Heartburn problems often go away after giving birth. HOME CARE  Take all medicine as told by your doctor.  Raise the head of your bed with blocks only as told by your doctor.  Do not exercise right after eating.  Avoid eating 2-3 hours before bed. Do not lie down right after eating.  Eat small meals throughout the day instead of 3 large meals.  Avoid foods that give you heartburn. Foods you may want to avoid include:  Peppers.  Chocolate.  High-fat foods, including fried foods.  Spicy foods.  Garlic and onions.  Citrus fruits, including oranges, grapefruit, lemons, and limes.  Food containing tomatoes or tomato products.  Mint.  Bubbly (carbonated) drinks and drinks with caffeine.  Vinegar. GET HELP IF:  You have any belly (abdominal) pain.  You feel burning in your upper belly or chest, especially after eating or lying down.  You feel sick to your stomach (nauseous) and throw up (vomit).  Your stomach feels upset after you eat. GET HELP RIGHT AWAY IF:  You have bad chest pain that goes down your arm or into your jaw or neck.  You feel sweaty, dizzy, or light-headed.  You have trouble breathing.  You throw up blood.  You have trouble or pain when swallowing.  You  have bloody or black poop (stool).  You have heartburn more than 3 times a week, for more than 2 weeks. MAKE SURE YOU:  Understand these instructions.  Will watch your condition.  Will get help right away if you are not doing well or get worse.   This information is not intended to replace advice given to you by your health care provider. Make sure you discuss any questions you have with your health care provider.   Document Released: 10/03/2010 Document Revised: 09/05/2013 Document Reviewed: 04/19/2013 Elsevier Interactive Patient Education 2016 [employer]. How a Baby Grows During Pregnancy Pregnancy begins when a female's sperm  enters a female's egg (fertilization). This happens in one of the tubes (fallopian tubes) that connect the ovaries to the womb (uterus). The fertilized egg is called an embryo until it reaches 10 weeks. From 10 weeks until birth, it is called a fetus. The fertilized egg moves down the fallopian tube to the uterus. Then it implants into the lining of the uterus and begins to grow. The developing fetus receives oxygen and nutrients through the pregnant woman's bloodstream and the tissues that grow (placenta) to support the fetus. The placenta is the life support system for the fetus. It provides nutrition and removes waste. Learning as much as you can about your pregnancy and how your baby is developing can help you enjoy the experience. It can also make you aware of when there might be a problem and when to ask questions. HOW LONG DOES A TYPICAL PREGNANCY LAST? A pregnancy usually lasts 280 days, or about 40 weeks. Pregnancy is divided into three trimesters:  First trimester: 0-13 weeks.  Second trimester: 14-27 weeks.  Third trimester: 28-40 weeks. The day when your baby is considered ready to be born (full term) is your estimated date of delivery. HOW DOES MY BABY DEVELOP MONTH BY MONTH? First month  The fertilized egg attaches to the inside of the uterus.  Some cells will form the placenta. Others will form the fetus.  The arms, legs, brain, spinal cord, lungs, and heart begin to develop.  At the end of the first month, the heart begins to beat. Second month  The bones, inner ear, eyelids, hands, and feet form.  The genitals develop.  By the end of 8 weeks, all major organs are developing. Third month  All of the internal organs are forming.  Teeth develop below the gums.  Bones and muscles begin to grow. The spine can flex.  The skin is transparent.  Fingernails and toenails begin to form.  Arms and legs continue to grow longer, and hands and feet develop.  The fetus is  about 3 in (7.6 cm) long. Fourth month  The placenta is completely formed.  The external sex organs, neck, outer ear, eyebrows, eyelids, and fingernails are formed.  The fetus can hear, swallow, and move its arms and legs.  The kidneys begin to produce urine.  The skin is covered with a white waxy coating (vernix) and very fine hair (lanugo). Fifth month  The fetus moves around more and can be felt for the first time (quickening).  The fetus starts to sleep and wake up and may begin to suck its finger.  The nails grow to the end of the fingers.  The organ in the digestive system that makes bile (gallbladder) functions and helps to digest the nutrients.  If your baby is a girl, eggs are present in her ovaries. If your baby is a boy, testicles  start to move down into his scrotum. Sixth month  The lungs are formed, but the fetus is not yet able to breathe.  The eyes open. The brain continues to develop.  Your baby has fingerprints and toe prints. Your baby's hair grows thicker.  At the end of the second trimester, the fetus is about 9 in (22.9 cm) long. Seventh month  The fetus kicks and stretches.  The eyes are developed enough to sense changes in light.  The hands can make a grasping motion.  The fetus responds to sound. Eighth month  All organs and body systems are fully developed and functioning.  Bones harden and taste buds develop. The fetus may hiccup.  Certain areas of the brain are still developing. The skull remains soft. Ninth month  The fetus gains about  lb (0.23 kg) each week.  The lungs are fully developed.  Patterns of sleep develop.  The fetus's head typically moves into a head-down position (vertex) in the uterus to prepare for birth. If the buttocks move into a vertex position instead, the baby is breech.  The fetus weighs 6-9 lbs (2.72-4.08 kg) and is 19-20 in (48.26-50.8 cm) long. WHAT CAN I DO TO HAVE A HEALTHY PREGNANCY AND HELP MY BABY  DEVELOP? Eating and Drinking  Eat a healthy diet.  Talk with your health care provider to make sure that you are getting the nutrients that you and your baby need.  Visit www.[employer].bechoosemyplate.gov to learn about creating a healthy diet.  Gain a healthy amount of weight during pregnancy as advised by your health care provider. This is usually 25-35 pounds. You may need to:  Gain more if you were underweight before getting pregnant or if you are pregnant with more than one baby.  Gain less if you were overweight or obese when you got pregnant. Medicines and Vitamins  Take prenatal vitamins as directed by your health care provider. These include vitamins such as folic acid, iron, calcium, and vitamin D. They are important for healthy development.  Take medicines only as directed by your health care provider. Read labels and ask a pharmacist or your health care provider whether over-the-counter medicines, supplements, and prescription drugs are safe to take during pregnancy. Activities  Be physically active as advised by your health care provider. Ask your health care provider to recommend activities that are safe for you to do, such as walking or swimming.  Do not participate in strenuous or extreme sports. Lifestyle  Do not drink alcohol.  Do not use any tobacco products, including cigarettes, chewing tobacco, or electronic cigarettes. If you need help quitting, ask your health care provider.  Do not use illegal drugs. Safety  Avoid exposure to mercury, lead, or other heavy metals. Ask your health care provider about common sources of these heavy metals.  Avoid listeria infection during pregnancy. Follow these precautions:  Do not eat soft cheeses or deli meats.  Do not eat hot dogs unless they have been warmed up to the point of steaming, such as in the microwave oven.  Do not drink unpasteurized milk.  Avoid toxoplasmosis infection during pregnancy. Follow these  precautions:  Do not change your cat's litter box, if you have a cat. Ask someone else to do this for you.  Wear gardening gloves while working in the yard. General Instructions  Keep all follow-up visits as directed by your health care provider. This is important. This includes prenatal care and screening tests.  Manage any chronic health conditions. Work  closely with your health care provider to keep conditions, such as diabetes, under control. HOW DO I KNOW IF MY BABY IS DEVELOPING WELL? At each prenatal visit, your health care provider will do several different tests to check on your health and keep track of your baby's development. These include:  Fundal height.  Your health care provider will measure your growing belly from top to bottom using a tape measure.  Your health care provider will also feel your belly to determine your baby's position.  Heartbeat.  An ultrasound in the first trimester can confirm pregnancy and show a heartbeat, depending on how far along you are.  Your health care provider will check your baby's heart rate at every prenatal visit.  As you get closer to your delivery date, you may have regular fetal heart rate monitoring to make sure that your baby is not in distress.  Second trimester ultrasound.  This ultrasound checks your baby's development. It also indicates your baby's gender. WHAT SHOULD I DO IF I HAVE CONCERNS ABOUT MY BABY'S DEVELOPMENT? Always talk with your health care provider about any concerns that you may have.   This information is not intended to replace advice given to you by your health care provider. Make sure you discuss any questions you have with your health care provider.   Document Released: 02/17/2008 Document Revised: 05/22/2015 Document Reviewed: 02/07/2014 Elsevier Interactive Patient Education 2016 [employer].  Second Trimester of Pregnancy The second trimester is from week 13 through week 28, month 4 through 6.  This is often the time in pregnancy that you feel your best. Often times, morning sickness has lessened or quit. You may have more energy, and you may get hungry more often. Your unborn baby (fetus) is growing rapidly. At the end of the sixth month, he or she is about 9 inches long and weighs about 1 pounds. You will likely feel the baby move (quickening) between 18 and 20 weeks of pregnancy. HOME CARE   Avoid all smoking, herbs, and alcohol. Avoid drugs not approved by your doctor.  Do not use any tobacco products, including cigarettes, chewing tobacco, and electronic cigarettes. If you need help quitting, ask your doctor. You may get counseling or other support to help you quit.  Only take medicine as told by your doctor. Some medicines are safe and some are not during pregnancy.  Exercise only as told by your doctor. Stop exercising if you start having cramps.  Eat regular, healthy meals.  Wear a good support bra if your breasts are tender.  Do not use hot tubs, steam rooms, or saunas.  Wear your seat belt when driving.  Avoid raw meat, uncooked cheese, and liter boxes and soil used by cats.  Take your prenatal vitamins.  Take 1500-2000 milligrams of calcium daily starting at the 20th week of pregnancy until you deliver your baby.  Try taking medicine that helps you poop (stool softener) as needed, and if your doctor approves. Eat more fiber by eating fresh fruit, vegetables, and whole grains. Drink enough fluids to keep your pee (urine) clear or pale yellow.  Take warm water baths (sitz baths) to soothe pain or discomfort caused by hemorrhoids. Use hemorrhoid cream if your doctor approves.  If you have puffy, bulging veins (varicose veins), wear support hose. Raise (elevate) your feet for 15 minutes, 3-4 times a day. Limit salt in your diet.  Avoid heavy lifting, wear low heals, and sit up straight.  Rest with your legs raised  if you have leg cramps or low back pain.  Visit  your dentist if you have not gone during your pregnancy. Use a soft toothbrush to brush your teeth. Be gentle when you floss.  You can have sex (intercourse) unless your doctor tells you not to.  Go to your doctor visits. GET HELP IF:   You feel dizzy.  You have mild cramps or pressure in your lower belly (abdomen).  You have a nagging pain in your belly area.  You continue to feel sick to your stomach (nauseous), throw up (vomit), or have watery poop (diarrhea).  You have bad smelling fluid coming from your vagina.  You have pain with peeing (urination). GET HELP RIGHT AWAY IF:   You have a fever.  You are leaking fluid from your vagina.  You have spotting or bleeding from your vagina.  You have severe belly cramping or pain.  You lose or gain weight rapidly.  You have trouble catching your breath and have chest pain.  You notice sudden or extreme puffiness (swelling) of your face, hands, ankles, feet, or legs.  You have not felt the baby move in over an hour.  You have severe headaches that do not go away with medicine.  You have vision changes.   This information is not intended to replace advice given to you by your health care provider. Make sure you discuss any questions you have with your health care provider.   Document Released: 11/25/2009 Document Revised: 09/21/2014 Document Reviewed: 11/01/2012 Elsevier Interactive Patient Education [employer]

## 2016-05-13 NOTE — Progress Notes (Signed)
Subjective:    Jacqueline [name] is a 40 y.o. female being seen today for her obstetrical visit. She is at 5180w6d gestation. Patient reports: heartburn, no bleeding, no contractions, no cramping and no leaking . Fetal movement: normal.  Problem List Items Addressed This Visit      Other   Supervision of high risk elderly multigravida in second trimester   Relevant Orders   POCT urinalysis dipstick (Completed)    Other Visit Diagnoses    Prenatal care, second trimester    -  Primary   Relevant Orders   POCT urinalysis dipstick (Completed)   Gastroesophageal reflux in pregancy, second trimester       Relevant Orders   POCT urinalysis dipstick (Completed)   GERD without esophagitis       Relevant Medications   omeprazole (PRILOSEC) 20 MG capsule     Patient Active Problem List   Diagnosis Date Noted  . [redacted] weeks gestation of pregnancy   . Supervision of high risk elderly multigravida in second trimester 04/02/2016  . Late prenatal care affecting pregnancy in second trimester, antepartum 04/02/2016  . AMA (advanced maternal age) multigravida 35+ 02/28/2014  . Normal delivery 02/28/2014  . Unspecified vitamin D deficiency 09/08/2013  . Supervision of other normal pregnancy 08/31/2013  . Elderly multigravida 08/31/2013  . Elderly multigravida with antepartum condition or complication 06/16/2011  . Family hx-consanguinity 06/16/2011   Objective:    BP 100/63   Pulse 80   Temp 99 F (37.2 C)   Wt 163 lb 6.4 oz (74.1 kg)   LMP 12/19/2015   BMI 28.05 kg/m  FHT: 160 BPM  Uterine Size: 22 cm and size equals dates     Assessment:    Pregnancy @ 280w6d    Plan:    OBGCT: discussed. Signs and symptoms of preterm labor: discussed.  Labs, problem list reviewed and updated 2 hr GTT planned Follow up in 4 weeks.

## 2016-05-19 ENCOUNTER — Other Ambulatory Visit: Payer: Self-pay | Admitting: Certified Nurse Midwife

## 2016-05-19 LAB — URINE CULTURE, OB REFLEX

## 2016-05-19 LAB — CULTURE, OB URINE

## 2016-06-10 ENCOUNTER — Ambulatory Visit (INDEPENDENT_AMBULATORY_CARE_PROVIDER_SITE_OTHER): Payer: Medicaid Other | Admitting: Certified Nurse Midwife

## 2016-06-10 VITALS — BP 106/67 | HR 100 | Temp 98.7°F | Wt 165.2 lb

## 2016-06-10 DIAGNOSIS — O09522 Supervision of elderly multigravida, second trimester: Secondary | ICD-10-CM

## 2016-06-10 NOTE — Progress Notes (Signed)
Patient had a brownish/pink discharge yesterday, none today

## 2016-06-10 NOTE — Progress Notes (Signed)
Subjective:    Jacqueline [name] is a 40 y.o. female being seen today for her obstetrical visit. She is at 2688w6d gestation. Patient reports: bleeding, no contractions, no cramping, no leaking and reports brown/red spotting yesturday.  Had sexual intercourse 2 days ago.  No spotting today, denies any contractions.   . Fetal movement: normal.  Problem List Items Addressed This Visit      Other   Supervision of high risk elderly multigravida in second trimester - Primary    Other Visit Diagnoses   None.    Patient Active Problem List   Diagnosis Date Noted  . [redacted] weeks gestation of pregnancy   . Supervision of high risk elderly multigravida in second trimester 04/02/2016  . Late prenatal care affecting pregnancy in second trimester, antepartum 04/02/2016  . AMA (advanced maternal age) multigravida 35+ 02/28/2014  . Normal delivery 02/28/2014  . Unspecified vitamin D deficiency 09/08/2013  . Supervision of other normal pregnancy 08/31/2013  . Elderly multigravida 08/31/2013  . Elderly multigravida with antepartum condition or complication 06/16/2011  . Family hx-consanguinity 06/16/2011   Objective:    BP 106/67   Pulse 100   Temp 98.7 F (37.1 C)   Wt 165 lb 3.2 oz (74.9 kg)   LMP 12/19/2015   BMI 28.36 kg/m  FHT: 150 BPM  Uterine Size: 25 cm and size equals dates     Assessment:    Pregnancy @ 3788w6d    Spotting in pregnancy r/t recent sexual intercourse  Plan:    OBGCT: discussed and ordered for next visit. Signs and symptoms of preterm labor: discussed.  Labs, problem list reviewed and updated 2 hr GTT planned Follow up in 3 weeks with 2 hour OGTT.

## 2016-06-24 ENCOUNTER — Other Ambulatory Visit (HOSPITAL_COMMUNITY): Payer: Self-pay | Admitting: Maternal and Fetal Medicine

## 2016-06-24 ENCOUNTER — Encounter (HOSPITAL_COMMUNITY): Payer: Self-pay

## 2016-06-24 ENCOUNTER — Ambulatory Visit (HOSPITAL_COMMUNITY)
Admission: RE | Admit: 2016-06-24 | Discharge: 2016-06-24 | Disposition: A | Discharge status: Home / Self Care | Payer: Medicaid Other | Source: Ambulatory Visit | Attending: Certified Nurse Midwife | Admitting: Certified Nurse Midwife

## 2016-06-24 VITALS — BP 103/59 | HR 86 | Wt 168.0 lb

## 2016-06-24 DIAGNOSIS — O0932 Supervision of pregnancy with insufficient antenatal care, second trimester: Secondary | ICD-10-CM | POA: Diagnosis not present

## 2016-06-24 DIAGNOSIS — Z3A26 26 weeks gestation of pregnancy: Secondary | ICD-10-CM | POA: Diagnosis not present

## 2016-06-24 DIAGNOSIS — O09529 Supervision of elderly multigravida, unspecified trimester: Secondary | ICD-10-CM

## 2016-06-24 DIAGNOSIS — O09522 Supervision of elderly multigravida, second trimester: Secondary | ICD-10-CM | POA: Insufficient documentation

## 2016-07-02 ENCOUNTER — Ambulatory Visit (INDEPENDENT_AMBULATORY_CARE_PROVIDER_SITE_OTHER): Payer: Medicaid Other | Admitting: Obstetrics and Gynecology

## 2016-07-02 ENCOUNTER — Other Ambulatory Visit: Payer: Medicaid Other

## 2016-07-02 VITALS — BP 81/55 | HR 82 | Temp 97.0°F | Wt 166.8 lb

## 2016-07-02 DIAGNOSIS — O09522 Supervision of elderly multigravida, second trimester: Secondary | ICD-10-CM

## 2016-07-02 DIAGNOSIS — O09523 Supervision of elderly multigravida, third trimester: Secondary | ICD-10-CM

## 2016-07-02 MED ORDER — TETANUS-DIPHTH-ACELL PERTUSSIS 5-2.5-18.5 LF-MCG/0.5 IM SUSP
0.5000 mL | Freq: Once | INTRAMUSCULAR | Status: AC
  Administered 2016-07-02: 0.5 mL via INTRAMUSCULAR

## 2016-07-02 NOTE — Progress Notes (Signed)
   PRENATAL VISIT NOTE  Subjective:  Jacqueline [name] is a 40 y.o. G5P4004 at 1982w0d being seen today for ongoing prenatal care.  She is currently monitored for the following issues for this high-risk pregnancy and has Elderly multigravida with antepartum condition or complication; Family hx-consanguinity; Supervision of high risk elderly multigravida in second trimester; Late prenatal care affecting pregnancy in second trimester, antepartum; and AMA (advanced maternal age) multigravida 35+ on her problem list.  Patient reports no complaints.  Contractions: Not present. Vag. Bleeding: None.  Movement: Present. Denies leaking of fluid.   The following portions of the patient's history were reviewed and updated as appropriate: allergies, current medications, past family history, past medical history, past social history, past surgical history and problem list. Problem list updated.  Objective:   Vitals:   07/02/16 0923  BP: (!) 81/55  Pulse: 82  Temp: 97 F (36.1 C)  Weight: 166 lb 12.8 oz (75.7 kg)    Fetal Status: Fetal Heart Rate (bpm): 146 Fundal Height: 28 cm Movement: Present     General:  Alert, oriented and cooperative. Patient is in no acute distress.  Skin: Skin is warm and dry. No rash noted.   Cardiovascular: Normal heart rate noted  Respiratory: Normal respiratory effort, no problems with respiration noted  Abdomen: Soft, gravid, appropriate for gestational age. Pain/Pressure: Absent     Pelvic:  Cervical exam deferred        Extremities: Normal range of motion.  Edema: Trace  Mental Status: Normal mood and affect. Normal behavior. Normal judgment and thought content.   Assessment and Plan:  Pregnancy: G5P4004 at 1882w0d  1. Supervision of high risk elderly multigravida in second trimester Patient is is doing well without complaints Third trimester labs today Patient declined flu vaccine Patient agrees TDap vaccine  2. Elderly multigravida in third trimester Normal  NIPS Follow up growth scheduled on 11/22 Start NST twice weekly at 36 weeks and [facility]  Preterm labor symptoms and general obstetric precautions including but not limited to vaginal bleeding, contractions, leaking of fluid and fetal movement were reviewed in detail with the patient. Please refer to After Visit Summary for other counseling recommendations.  No Follow-up on file.  Catalina AntiguaPeggy Izella Ybanez, MD

## 2016-07-02 NOTE — Progress Notes (Signed)
Patient is in the office for appt and states that she is feeling good, reports good fetal movement.

## 2016-07-02 NOTE — Addendum Note (Signed)
Addended by: Natale MilchSTALLING, BRITTANY D on: 07/02/2016 10:22 AM   Modules accepted: Orders

## 2016-07-03 LAB — HIV ANTIBODY (ROUTINE TESTING W REFLEX): HIV Screen 4th Generation wRfx: NONREACTIVE

## 2016-07-03 LAB — CBC
Hematocrit: 31.7 % — ABNORMAL LOW (ref 34.0–46.6)
Hemoglobin: 10.4 g/dL — ABNORMAL LOW (ref 11.1–15.9)
MCH: 27.2 pg (ref 26.6–33.0)
MCHC: 32.8 g/dL (ref 31.5–35.7)
MCV: 83 fL (ref 79–97)
Platelets: 182 10*3/uL (ref 150–379)
RBC: 3.82 x10E6/uL (ref 3.77–5.28)
RDW: 13.9 % (ref 12.3–15.4)
WBC: 7.3 10*3/uL (ref 3.4–10.8)

## 2016-07-03 LAB — GLUCOSE TOLERANCE, 2 HOURS W/ 1HR
GLUCOSE, FASTING: 81 mg/dL (ref 65–91)
Glucose, 1 hour: 106 mg/dL (ref 65–179)
Glucose, 2 hour: 94 mg/dL (ref 65–152)

## 2016-07-03 LAB — RPR: RPR: NONREACTIVE

## 2016-07-16 ENCOUNTER — Encounter: Payer: Medicaid Other | Admitting: Obstetrics & Gynecology

## 2016-07-17 ENCOUNTER — Encounter: Payer: Medicaid Other | Admitting: Obstetrics & Gynecology

## 2016-07-20 ENCOUNTER — Encounter: Payer: Medicaid Other | Admitting: Obstetrics & Gynecology

## 2016-08-04 ENCOUNTER — Ambulatory Visit (INDEPENDENT_AMBULATORY_CARE_PROVIDER_SITE_OTHER): Payer: Medicaid Other | Admitting: Certified Nurse Midwife

## 2016-08-04 VITALS — BP 102/67 | HR 91

## 2016-08-04 DIAGNOSIS — O09529 Supervision of elderly multigravida, unspecified trimester: Secondary | ICD-10-CM

## 2016-08-04 DIAGNOSIS — Z843 Family history of consanguinity: Secondary | ICD-10-CM

## 2016-08-04 DIAGNOSIS — O26843 Uterine size-date discrepancy, third trimester: Secondary | ICD-10-CM | POA: Insufficient documentation

## 2016-08-04 DIAGNOSIS — O09522 Supervision of elderly multigravida, second trimester: Secondary | ICD-10-CM

## 2016-08-04 DIAGNOSIS — O09523 Supervision of elderly multigravida, third trimester: Secondary | ICD-10-CM

## 2016-08-04 NOTE — Progress Notes (Signed)
Subjective:    Jacqueline [name] is a 40 y.o. female being seen today for her obstetrical visit. She is at 7868w5d gestation. Patient reports no complaints. Fetal movement: normal.  Problem List Items Addressed This Visit      Other   Elderly multigravida with antepartum condition or complication - Primary   Family hx-consanguinity   AMA (advanced maternal age) multigravida 35+   Supervision of high risk elderly multigravida in second trimester   Uterine size date discrepancy, antepartum, third trimester     Patient Active Problem List   Diagnosis Date Noted  . Uterine size date discrepancy, antepartum, third trimester 08/04/2016  . Supervision of high risk elderly multigravida in second trimester 04/02/2016  . Late prenatal care affecting pregnancy in second trimester, antepartum 04/02/2016  . AMA (advanced maternal age) multigravida 35+ 02/28/2014  . Elderly multigravida with antepartum condition or complication 06/16/2011  . Family hx-consanguinity 06/16/2011   Objective:    BP 102/67   Pulse 91   LMP 12/19/2015  FHT:  150 BPM  Uterine Size: 35 cm and size greater than dates  Presentation: cephalic     Assessment:    Pregnancy @ 8568w5d weeks   AMA  S>D  Plan:   US scheduled for 08/05/16   labs reviewed, problem list updated Consent signed. GBS planning TDAP offered  Rhogam given for RH negative Pediatrician: discussed. Infant feeding: plans to breastfeed. Maternity leave: discussed. Cigarette smoking: never smoked. No orders of the defined types were placed in this encounter.  No orders of the defined types were placed in this encounter.  Follow up in 2 Weeks.

## 2016-08-04 NOTE — Progress Notes (Signed)
Pt has complaints of constipation.

## 2016-08-05 ENCOUNTER — Ambulatory Visit (HOSPITAL_COMMUNITY)
Admission: RE | Admit: 2016-08-05 | Discharge: 2016-08-05 | Disposition: A | Discharge status: Home / Self Care | Payer: Medicaid Other | Source: Ambulatory Visit | Attending: Certified Nurse Midwife | Admitting: Certified Nurse Midwife

## 2016-08-05 ENCOUNTER — Encounter (HOSPITAL_COMMUNITY): Payer: Self-pay

## 2016-08-05 ENCOUNTER — Other Ambulatory Visit (HOSPITAL_COMMUNITY): Payer: Self-pay | Admitting: Maternal and Fetal Medicine

## 2016-08-05 DIAGNOSIS — O09523 Supervision of elderly multigravida, third trimester: Secondary | ICD-10-CM | POA: Insufficient documentation

## 2016-08-05 DIAGNOSIS — Z3A32 32 weeks gestation of pregnancy: Secondary | ICD-10-CM | POA: Diagnosis not present

## 2016-08-05 DIAGNOSIS — O09529 Supervision of elderly multigravida, unspecified trimester: Secondary | ICD-10-CM

## 2016-08-24 ENCOUNTER — Encounter: Payer: Medicaid Other | Admitting: Certified Nurse Midwife

## 2016-09-14 NOTE — L&D Delivery Note (Signed)
41 y.o. G5P4004 at 2042w0d delivered a viable female infant in cephalic, ROA position. Anterior shoulder delivered with ease. 60 sec delayed cord clamping. Cord clamped x2 and cut. Placenta delivered spontaneously intact, with 3VC. Fundus firm on exam with massage and pitocin. Good hemostasis noted.  Laceration: minor 1st degree, not requiring any repair Suture: none  Good hemostasis noted. EBL: 100cc  Mom and baby recovering in LDR.    Apgars: 8/9 Weight: pending    Renne Muscaaniel L Warden, MD PGY-1 09/24/2016, 5:40 PM   OB FELLOW DELIVERY ATTESTATION  I was gloved and present for the delivery in its entirety, and I agree with the above resident's note.    Jen MowElizabeth Mady Oubre, DO OB Fellow 6:01 PM

## 2016-09-15 ENCOUNTER — Ambulatory Visit (INDEPENDENT_AMBULATORY_CARE_PROVIDER_SITE_OTHER): Payer: Medicaid Other | Admitting: Certified Nurse Midwife

## 2016-09-15 VITALS — BP 104/67 | HR 87 | Wt 168.0 lb

## 2016-09-15 DIAGNOSIS — O09523 Supervision of elderly multigravida, third trimester: Secondary | ICD-10-CM | POA: Diagnosis not present

## 2016-09-15 DIAGNOSIS — O0933 Supervision of pregnancy with insufficient antenatal care, third trimester: Secondary | ICD-10-CM | POA: Diagnosis not present

## 2016-09-15 DIAGNOSIS — Z843 Family history of consanguinity: Secondary | ICD-10-CM

## 2016-09-15 DIAGNOSIS — O09529 Supervision of elderly multigravida, unspecified trimester: Secondary | ICD-10-CM

## 2016-09-15 DIAGNOSIS — O09522 Supervision of elderly multigravida, second trimester: Secondary | ICD-10-CM

## 2016-09-15 LAB — OB RESULTS CONSOLE GBS
GBS: NEGATIVE
GBS: POSITIVE
GBS: POSITIVE
STREP GROUP B AG: POSITIVE

## 2016-09-15 LAB — OB RESULTS CONSOLE GC/CHLAMYDIA
Chlamydia: NEGATIVE
GC PROBE AMP, GENITAL: NEGATIVE

## 2016-09-15 NOTE — Progress Notes (Signed)
Subjective:    Jacqueline [name] is a 41 y.o. female being seen today for her obstetrical visit. She is at 5263w5d gestation. Patient reports backache, no bleeding, no leaking and occasional contractions. Fetal movement: normal.  Problem List Items Addressed This Visit      Other   Elderly multigravida with antepartum condition or complication - Primary   Relevant Orders   US MFM OB FOLLOW UP   Strep Gp B NAA   NuSwab Vaginitis Plus (VG+)   Fetal nonstress test   US Fetal BPP W/O Non Stress   Family hx-consanguinity   AMA (advanced maternal age) multigravida 35+   Supervision of high risk elderly multigravida in second trimester   Relevant Orders   US MFM OB FOLLOW UP   US Fetal BPP W/O Non Stress    Other Visit Diagnoses    Limited prenatal care in third trimester       Relevant Orders   US Fetal BPP W/O Non Stress     Patient Active Problem List   Diagnosis Date Noted  . Uterine size date discrepancy, antepartum, third trimester 08/04/2016  . Supervision of high risk elderly multigravida in second trimester 04/02/2016  . Late prenatal care affecting pregnancy in second trimester, antepartum 04/02/2016  . AMA (advanced maternal age) multigravida 35+ 02/28/2014  . Elderly multigravida with antepartum condition or complication 06/16/2011  . Family hx-consanguinity 06/16/2011    Objective:    BP 104/67   Pulse 87   Wt 168 lb (76.2 kg)   LMP 12/19/2015   BMI 28.84 kg/m  FHT: 145 BPM  Uterine Size: 41 cm and size greater than dates  Presentations: cephalic  Pelvic Exam:              Dilation: Closed       Effacement: Long             Station:  -3    Consistency: medium            Position: posterior    NST: + accels, no decels, moderate variability, Cat. 1 tracing. No contractions on toco.    Assessment:    Pregnancy @ 8163w5d weeks   Lack of prenatal care for 6 weeks  Reactive NST  Plan:   Plans for delivery: Vaginal anticipated; labs reviewed; problem list  updated Counseling: Consent signed. Infant feeding: plans to breastfeed. Cigarette smoking: never smoked. L&D discussion: symptoms of labor, discussed when to call, discussed what number to call, anesthetic/analgesic options reviewed and delivering clinician:  plans no preference. Postpartum supports and preparation: circumcision discussed and contraception plans discussed.  Follow up in twice weekly.

## 2016-09-15 NOTE — Progress Notes (Signed)
Pt states ctx's are occurring more often so she requests cx check today.

## 2016-09-16 ENCOUNTER — Ambulatory Visit (HOSPITAL_COMMUNITY): Payer: Medicaid Other

## 2016-09-16 LAB — OB RESULTS CONSOLE GBS: STREP GROUP B AG: POSITIVE

## 2016-09-17 ENCOUNTER — Encounter (HOSPITAL_COMMUNITY): Payer: Self-pay | Admitting: *Deleted

## 2016-09-17 ENCOUNTER — Other Ambulatory Visit: Payer: Self-pay | Admitting: Certified Nurse Midwife

## 2016-09-17 ENCOUNTER — Telehealth (HOSPITAL_COMMUNITY): Payer: Self-pay | Admitting: *Deleted

## 2016-09-17 DIAGNOSIS — O0990 Supervision of high risk pregnancy, unspecified, unspecified trimester: Secondary | ICD-10-CM | POA: Insufficient documentation

## 2016-09-17 DIAGNOSIS — O0993 Supervision of high risk pregnancy, unspecified, third trimester: Secondary | ICD-10-CM

## 2016-09-17 LAB — NUSWAB VAGINITIS PLUS (VG+)
CANDIDA GLABRATA, NAA: NEGATIVE
CHLAMYDIA TRACHOMATIS, NAA: NEGATIVE
Candida albicans, NAA: NEGATIVE
Neisseria gonorrhoeae, NAA: NEGATIVE
TRICH VAG BY NAA: NEGATIVE

## 2016-09-17 LAB — STREP GP B NAA: Strep Gp B NAA: POSITIVE — AB

## 2016-09-17 NOTE — Telephone Encounter (Signed)
Preadmission screen  

## 2016-09-22 ENCOUNTER — Encounter (HOSPITAL_COMMUNITY): Payer: Self-pay

## 2016-09-22 ENCOUNTER — Other Ambulatory Visit: Payer: Self-pay | Admitting: Certified Nurse Midwife

## 2016-09-22 ENCOUNTER — Ambulatory Visit (HOSPITAL_COMMUNITY)
Admission: RE | Admit: 2016-09-22 | Discharge: 2016-09-22 | Disposition: A | Discharge status: Home / Self Care | Payer: Medicaid Other | Source: Ambulatory Visit | Attending: Certified Nurse Midwife | Admitting: Certified Nurse Midwife

## 2016-09-22 DIAGNOSIS — Z3A39 39 weeks gestation of pregnancy: Secondary | ICD-10-CM

## 2016-09-22 DIAGNOSIS — O09523 Supervision of elderly multigravida, third trimester: Secondary | ICD-10-CM | POA: Diagnosis present

## 2016-09-22 DIAGNOSIS — O09522 Supervision of elderly multigravida, second trimester: Secondary | ICD-10-CM

## 2016-09-22 DIAGNOSIS — O09529 Supervision of elderly multigravida, unspecified trimester: Secondary | ICD-10-CM

## 2016-09-23 ENCOUNTER — Ambulatory Visit (INDEPENDENT_AMBULATORY_CARE_PROVIDER_SITE_OTHER): Payer: Medicaid Other | Admitting: Certified Nurse Midwife

## 2016-09-23 ENCOUNTER — Other Ambulatory Visit: Payer: Self-pay | Admitting: Advanced Practice Midwife

## 2016-09-23 VITALS — BP 107/70 | HR 85 | Temp 97.9°F | Wt 170.3 lb

## 2016-09-23 DIAGNOSIS — O26843 Uterine size-date discrepancy, third trimester: Secondary | ICD-10-CM

## 2016-09-23 DIAGNOSIS — O09529 Supervision of elderly multigravida, unspecified trimester: Secondary | ICD-10-CM

## 2016-09-23 DIAGNOSIS — O09523 Supervision of elderly multigravida, third trimester: Secondary | ICD-10-CM | POA: Diagnosis not present

## 2016-09-23 DIAGNOSIS — O99820 Streptococcus B carrier state complicating pregnancy: Secondary | ICD-10-CM

## 2016-09-23 DIAGNOSIS — O3660X0 Maternal care for excessive fetal growth, unspecified trimester, not applicable or unspecified: Secondary | ICD-10-CM | POA: Insufficient documentation

## 2016-09-23 DIAGNOSIS — O3663X0 Maternal care for excessive fetal growth, third trimester, not applicable or unspecified: Secondary | ICD-10-CM

## 2016-09-23 DIAGNOSIS — O0993 Supervision of high risk pregnancy, unspecified, third trimester: Secondary | ICD-10-CM

## 2016-09-23 DIAGNOSIS — Z843 Family history of consanguinity: Secondary | ICD-10-CM

## 2016-09-23 NOTE — Progress Notes (Signed)
   PRENATAL VISIT NOTE  Subjective:  Jacqueline [name] is a 41 y.o. G5P4004 at 1674w6d being seen today for ongoing prenatal care.  She is currently monitored for the following issues for this high-risk pregnancy and has Elderly multigravida with antepartum condition or complication; Family hx-consanguinity; GBS (group B Streptococcus carrier), +RV culture, currently pregnant; Late prenatal care affecting pregnancy in second trimester, antepartum; AMA (advanced maternal age) multigravida 35+; Uterine size date discrepancy, antepartum, third trimester; Supervision of high-risk pregnancy; and LGA (large for gestational age) fetus affecting management of mother on her problem list.  Patient reports backache, no bleeding, no leaking and occasional contractions.  Contractions: Irregular. Vag. Bleeding: None.  Movement: Present. Denies leaking of fluid.   The following portions of the patient's history were reviewed and updated as appropriate: allergies, current medications, past family history, past medical history, past social history, past surgical history and problem list. Problem list updated.  Objective:   Vitals:   09/23/16 1530  BP: 107/70  Pulse: 85  Temp: 97.9 F (36.6 C)  Weight: 170 lb 4.8 oz (77.2 kg)    Fetal Status:     Movement: Present     General:  Alert, oriented and cooperative. Patient is in no acute distress.  Skin: Skin is warm and dry. No rash noted.   Cardiovascular: Normal heart rate noted  Respiratory: Normal respiratory effort, no problems with respiration noted  Abdomen: Soft, gravid, appropriate for gestational age. Pain/Pressure: Present     Pelvic:  Cervical exam deferred        Extremities: Normal range of motion.  Edema: Trace  Mental Status: Normal mood and affect. Normal behavior. Normal judgment and thought content.   Assessment and Plan:  Pregnancy: G5P4004 at 6274w6d  1. Elderly multigravida with antepartum condition or complication     LGA on US 09/22/16  >90%, EFW: 8#13oz  2. Family hx-consanguinity      3. Elderly multigravida in third trimester      Reactive NST  NST: + accels, no decels, moderate variability, Cat. 1 tracing. Occasional mild contractions  on  toco.   4. GBS (group B Streptococcus carrier), +RV culture, currently pregnant     PCN in labor  5. Supervision of high risk pregnancy in third trimester      Induction scheduled for 09/24/16  Term labor symptoms and general obstetric precautions including but not limited to vaginal bleeding, contractions, leaking of fluid and fetal movement were reviewed in detail with the patient. Please refer to After Visit Summary for other counseling recommendations.  No Follow-up on file.   Roe Coombsachelle A Lyncoln Ledgerwood, CNM

## 2016-09-24 ENCOUNTER — Inpatient Hospital Stay (HOSPITAL_COMMUNITY): Payer: Medicaid Other | Admitting: Anesthesiology

## 2016-09-24 ENCOUNTER — Inpatient Hospital Stay (HOSPITAL_COMMUNITY)
Admission: AD | Admit: 2016-09-24 | Discharge: 2016-09-26 | DRG: 775 | Disposition: A | Discharge status: Home / Self Care | Payer: Medicaid Other | Source: Ambulatory Visit | Attending: Obstetrics and Gynecology | Admitting: Obstetrics and Gynecology

## 2016-09-24 ENCOUNTER — Inpatient Hospital Stay (HOSPITAL_COMMUNITY): Admission: RE | Admit: 2016-09-24 | Payer: Medicaid Other | Source: Ambulatory Visit

## 2016-09-24 ENCOUNTER — Encounter (HOSPITAL_COMMUNITY): Payer: Self-pay | Admitting: *Deleted

## 2016-09-24 DIAGNOSIS — O4202 Full-term premature rupture of membranes, onset of labor within 24 hours of rupture: Secondary | ICD-10-CM | POA: Diagnosis present

## 2016-09-24 DIAGNOSIS — Z833 Family history of diabetes mellitus: Secondary | ICD-10-CM | POA: Diagnosis not present

## 2016-09-24 DIAGNOSIS — O3660X0 Maternal care for excessive fetal growth, unspecified trimester, not applicable or unspecified: Secondary | ICD-10-CM | POA: Diagnosis present

## 2016-09-24 DIAGNOSIS — O3663X0 Maternal care for excessive fetal growth, third trimester, not applicable or unspecified: Principal | ICD-10-CM | POA: Diagnosis present

## 2016-09-24 DIAGNOSIS — K219 Gastro-esophageal reflux disease without esophagitis: Secondary | ICD-10-CM | POA: Diagnosis present

## 2016-09-24 DIAGNOSIS — O9962 Diseases of the digestive system complicating childbirth: Secondary | ICD-10-CM | POA: Diagnosis present

## 2016-09-24 DIAGNOSIS — O99824 Streptococcus B carrier state complicating childbirth: Secondary | ICD-10-CM | POA: Diagnosis present

## 2016-09-24 DIAGNOSIS — Z3A40 40 weeks gestation of pregnancy: Secondary | ICD-10-CM | POA: Diagnosis not present

## 2016-09-24 DIAGNOSIS — Z8249 Family history of ischemic heart disease and other diseases of the circulatory system: Secondary | ICD-10-CM | POA: Diagnosis not present

## 2016-09-24 LAB — TYPE AND SCREEN
ABO/RH(D): B POS
ANTIBODY SCREEN: NEGATIVE

## 2016-09-24 LAB — RPR: RPR Ser Ql: NONREACTIVE

## 2016-09-24 LAB — CBC
HCT: 32.9 % — ABNORMAL LOW (ref 36.0–46.0)
Hemoglobin: 10.6 g/dL — ABNORMAL LOW (ref 12.0–15.0)
MCH: 24.1 pg — ABNORMAL LOW (ref 26.0–34.0)
MCHC: 32.2 g/dL (ref 30.0–36.0)
MCV: 74.9 fL — AB (ref 78.0–100.0)
PLATELETS: 191 10*3/uL (ref 150–400)
RBC: 4.39 MIL/uL (ref 3.87–5.11)
RDW: 15.5 % (ref 11.5–15.5)
WBC: 7.2 10*3/uL (ref 4.0–10.5)

## 2016-09-24 MED ORDER — DIPHENHYDRAMINE HCL 50 MG/ML IJ SOLN: 12.5000 mg | INTRAMUSCULAR | Status: DC | PRN

## 2016-09-24 MED ORDER — EPHEDRINE 5 MG/ML INJ
10.0000 mg | INTRAVENOUS | Status: DC | PRN
  Filled 2016-09-24: qty 4

## 2016-09-24 MED ORDER — IBUPROFEN 600 MG PO TABS
600.0000 mg | ORAL_TABLET | Freq: Four times a day (QID) | ORAL | Status: DC
  Administered 2016-09-25 – 2016-09-26 (×6): 600 mg via ORAL
  Filled 2016-09-24 (×8): qty 1

## 2016-09-24 MED ORDER — LACTATED RINGERS IV SOLN
500.0000 mL | INTRAVENOUS | Status: DC | PRN
  Administered 2016-09-24: 500 mL via INTRAVENOUS

## 2016-09-24 MED ORDER — PENICILLIN G POTASSIUM 5000000 UNITS IJ SOLR
5.0000 10*6.[IU] | Freq: Once | INTRAVENOUS | Status: AC
  Administered 2016-09-24: 5 10*6.[IU] via INTRAVENOUS
  Filled 2016-09-24: qty 5

## 2016-09-24 MED ORDER — FENTANYL CITRATE (PF) 100 MCG/2ML IJ SOLN
100.0000 ug | INTRAMUSCULAR | Status: DC | PRN
  Administered 2016-09-24: 100 ug via INTRAVENOUS
  Filled 2016-09-24: qty 2

## 2016-09-24 MED ORDER — LACTATED RINGERS IV SOLN: 500.0000 mL | Freq: Once | INTRAVENOUS | Status: DC

## 2016-09-24 MED ORDER — LACTATED RINGERS IV SOLN
INTRAVENOUS | Status: DC
  Administered 2016-09-24: 13:00:00 via INTRAVENOUS

## 2016-09-24 MED ORDER — LIDOCAINE HCL (PF) 1 % IJ SOLN
INTRAMUSCULAR | Status: DC | PRN
  Administered 2016-09-24: 4 mL via EPIDURAL

## 2016-09-24 MED ORDER — SOD CITRATE-CITRIC ACID 500-334 MG/5ML PO SOLN: 30.0000 mL | ORAL | Status: DC | PRN

## 2016-09-24 MED ORDER — EPHEDRINE 5 MG/ML INJ: 10.0000 mg | INTRAVENOUS | Status: DC | PRN

## 2016-09-24 MED ORDER — LACTATED RINGERS IV SOLN
500.0000 mL | Freq: Once | INTRAVENOUS | Status: AC
  Administered 2016-09-24: 500 mL via INTRAVENOUS

## 2016-09-24 MED ORDER — ONDANSETRON HCL 4 MG/2ML IJ SOLN
4.0000 mg | Freq: Four times a day (QID) | INTRAMUSCULAR | Status: DC | PRN
  Administered 2016-09-24: 4 mg via INTRAVENOUS
  Filled 2016-09-24: qty 2

## 2016-09-24 MED ORDER — OXYCODONE-ACETAMINOPHEN 5-325 MG PO TABS: 1.0000 | ORAL_TABLET | ORAL | Status: DC | PRN

## 2016-09-24 MED ORDER — PANTOPRAZOLE SODIUM 20 MG PO TBEC
20.0000 mg | DELAYED_RELEASE_TABLET | Freq: Every day | ORAL | Status: DC
  Administered 2016-09-24: 20 mg via ORAL
  Filled 2016-09-24 (×2): qty 1

## 2016-09-24 MED ORDER — PENICILLIN G POT IN DEXTROSE 60000 UNIT/ML IV SOLN
3.0000 10*6.[IU] | INTRAVENOUS | Status: DC
  Administered 2016-09-24: 3 10*6.[IU] via INTRAVENOUS
  Filled 2016-09-24 (×7): qty 50

## 2016-09-24 MED ORDER — LIDOCAINE HCL (PF) 1 % IJ SOLN
30.0000 mL | INTRAMUSCULAR | Status: DC | PRN
  Filled 2016-09-24: qty 30

## 2016-09-24 MED ORDER — FENTANYL 2.5 MCG/ML BUPIVACAINE 1/10 % EPIDURAL INFUSION (WH - ANES)
14.0000 mL/h | INTRAMUSCULAR | Status: DC | PRN
Start: 2016-09-24 — End: 2016-09-25
  Administered 2016-09-24: 14 mL/h via EPIDURAL
  Filled 2016-09-24: qty 100

## 2016-09-24 MED ORDER — ACETAMINOPHEN 325 MG PO TABS: 650.0000 mg | ORAL_TABLET | ORAL | Status: DC | PRN

## 2016-09-24 MED ORDER — PHENYLEPHRINE 40 MCG/ML (10ML) SYRINGE FOR IV PUSH (FOR BLOOD PRESSURE SUPPORT): 80.0000 ug | PREFILLED_SYRINGE | INTRAVENOUS | Status: DC | PRN

## 2016-09-24 MED ORDER — OXYTOCIN 40 UNITS IN LACTATED RINGERS INFUSION - SIMPLE MED: 2.5000 [IU]/h | INTRAVENOUS | Status: DC

## 2016-09-24 MED ORDER — OXYTOCIN 40 UNITS IN LACTATED RINGERS INFUSION - SIMPLE MED
1.0000 m[IU]/min | INTRAVENOUS | Status: DC
  Administered 2016-09-24: 2 m[IU]/min via INTRAVENOUS
  Filled 2016-09-24: qty 1000

## 2016-09-24 MED ORDER — PHENYLEPHRINE 40 MCG/ML (10ML) SYRINGE FOR IV PUSH (FOR BLOOD PRESSURE SUPPORT)
80.0000 ug | PREFILLED_SYRINGE | INTRAVENOUS | Status: DC | PRN
  Administered 2016-09-24 (×2): 80 ug via INTRAVENOUS
  Filled 2016-09-24: qty 10
  Filled 2016-09-24: qty 5

## 2016-09-24 MED ORDER — OXYTOCIN BOLUS FROM INFUSION
500.0000 mL | Freq: Once | INTRAVENOUS | Status: AC
  Administered 2016-09-24: 500 mL via INTRAVENOUS

## 2016-09-24 MED ORDER — PHENYLEPHRINE 40 MCG/ML (10ML) SYRINGE FOR IV PUSH (FOR BLOOD PRESSURE SUPPORT)
80.0000 ug | PREFILLED_SYRINGE | INTRAVENOUS | Status: DC | PRN
  Administered 2016-09-24: 80 ug via INTRAVENOUS
  Filled 2016-09-24: qty 5

## 2016-09-24 MED ORDER — TERBUTALINE SULFATE 1 MG/ML IJ SOLN
0.2500 mg | Freq: Once | INTRAMUSCULAR | Status: DC | PRN
Start: 2016-09-24 — End: 2016-09-25
  Filled 2016-09-24: qty 1

## 2016-09-24 MED ORDER — OXYCODONE-ACETAMINOPHEN 5-325 MG PO TABS: 2.0000 | ORAL_TABLET | ORAL | Status: DC | PRN

## 2016-09-24 NOTE — Anesthesia Procedure Notes (Signed)
Epidural Patient location during procedure: OB Start time: 09/24/2016 12:48 PM End time: 09/24/2016 12:54 PM  Staffing Anesthesiologist: Shona SimpsonHOLLIS, Bates [place] D Performed: anesthesiologist   Preanesthetic Checklist Completed: patient identified, site marked, surgical consent, pre-op evaluation, timeout performed, IV checked, risks and benefits discussed and monitors and equipment checked  Epidural Patient position: sitting Prep: ChloraPrep Patient monitoring: heart rate, continuous pulse ox and blood pressure Approach: midline Location: L3-L4 Injection technique: LOR saline  Needle:  Needle type: Tuohy  Needle gauge: 17 G Needle length: 9 cm Catheter type: closed end flexible Catheter size: 20 Guage Test dose: negative and 1.5% lidocaine  Assessment Events: blood not aspirated, injection not painful, no injection resistance and no paresthesia  Additional Notes LOR @ 5  Patient identified. Risks/Benefits/Options discussed with patient including but not limited to bleeding, infection, nerve damage, paralysis, failed block, incomplete pain control, headache, blood pressure changes, nausea, vomiting, reactions to medications, itching and postpartum back pain. Confirmed with bedside nurse the patient's most recent platelet count. Confirmed with patient that they are not currently taking any anticoagulation, have any bleeding history or any family history of bleeding disorders. Patient expressed understanding and wished to proceed. All questions were answered. Sterile technique was used throughout the entire procedure. Please see nursing notes for vital signs. Test dose was given through epidural catheter and negative prior to continuing to dose epidural or start infusion. Warning signs of high block given to the patient including shortness of breath, tingling/numbness in hands, complete motor block, or any concerning symptoms with instructions to call for help. Patient was given instructions on  fall risk and not to get out of bed. All questions and concerns addressed with instructions to call with any issues or inadequate analgesia.    Reason for block:procedure for pain

## 2016-09-24 NOTE — Anesthesia Pain Management Evaluation Note (Signed)
  CRNA Pain Management Visit Note  Patient: Jacqueline [name], 41 y.o., female  "Hello I am a [facility] of the [facility]. We have an anesthesia team available at all times to provide care throughout the hospital, including epidural management and anesthesia for C-section. I don't know your plan for the delivery whether it a natural birth, water birth, IV sedation, nitrous supplementation, doula or epidural, but we want to meet your pain goals."   1.Was your pain managed to your expectations on prior hospitalizations?   Yes   2.What is your expectation for pain management during this hospitalization?     Epidural  3.How can we help you reach that goal? unsure  Record the patient's initial score and the patient's pain goal.   Pain: 5  Pain Goal: 6 [facility] wants you to be able to say your pain was always managed very well.  Cephus ShellingBURGER,Lubertha Leite 09/24/2016

## 2016-09-24 NOTE — H&P (Signed)
LABOR AND DELIVERY ADMISSION HISTORY AND PHYSICAL NOTE  Jacqueline [name] is a 41 y.o. female [phone] with IUP at [redacted]w[redacted]d by [place] presenting after SROM at 0530 this AM.    This morning she was having contractions every 30 min and has LOF at 0530 and noted a pinkish tint. She denied any HA, changes in vision, SOB, CP, NVD. She reports positive fetal movement. She denies vaginal bleeding. She is being monitored for high-risk pregnancy and has elderly multigravida with antepartum condition or complication; Family hx-consanguinity; Supervision of high risk elderly multigravida in second trimester; Late prenatal care affecting pregnancy in second trimester, antepartum; and AMA (advanced maternal age) multigravida 35+ on her problem list.  Prenatal History/Complications:  Past Medical History: Past Medical History:  Diagnosis Date  . Medical history non-contributory   . No pertinent past medical history     Past Surgical History: Past Surgical History:  Procedure Laterality Date  . NO PAST SURGERIES      Obstetrical History: OB History    Gravida Para Term Preterm AB Living   5 4 4  0 0 4   SAB TAB Ectopic Multiple Live Births   0 0 0   4      Social History: Social History   Social History  . Marital status: Married    Spouse name: N/A  . Number of children: N/A  . Years of education: N/A   Social History Main Topics  . Smoking status: Never Smoker  . Smokeless tobacco: Never Used  . Alcohol use No  . Drug use: No  . Sexual activity: Not Currently    Birth control/ protection: None   Other Topics Concern  . None   Social History Narrative   ** Merged History Encounter **        Family History: Family History  Problem Relation Age of Onset  . Diabetes Father   . Hypertension Father   . Hypertension Mother     Allergies: No Known Allergies  Prescriptions Prior to Admission  Medication Sig Dispense Refill Last Dose  . omeprazole (PRILOSEC) 20 MG capsule Take 1  capsule (20 mg total) by mouth 2 (two) times daily before a meal. 60 capsule 5 09/23/2016 at Unknown time  . prenatal vitamin w/FE, FA (PRENATAL 1 + 1) 27-1 MG TABS Take 1 tablet by mouth daily.     09/23/2016 at Unknown time  . Prenat-FeAsp-Meth-FA-DHA w/o A (PRENATE PIXIE) 10-0.6-0.4-200 MG CAPS Take 1 tablet by mouth daily. (Patient not taking: Reported on 09/22/2016) 30 capsule 12 Not Taking     Review of Systems   All systems reviewed and negative except as stated in HPI  Blood pressure 107/63, pulse 77, temperature 98.1 F (36.7 C), resp. rate 18, height 5\' 3"  (1.6 m), weight 75.4 kg (166 lb 4 oz), last menstrual period 12/19/2015, SpO2 100 %, unknown if currently breastfeeding. General appearance: alert, cooperative and appears stated age Lungs: clear to auscultation bilaterally Heart: regular rate and rhythm Abdomen: soft, non-tender; bowel sounds normal Extremities: No calf swelling or tenderness Presentation: cephalic, vertex Fetal monitoring: 145, moderate variability, pos acels, no decels Uterine activity: q2-3 min      Prenatal labs: ABO, Rh: --/--/B POS (01/11 0645) Antibody: NEG (01/11 0645) Rubella: 11.3 RPR: Non Reactive (10/19 1120)  HBsAg: Negative (07/20 1504)  HIV: Non Reactive (10/19 1120)  GBS: Positive (01/02 1702)  1 hr Glucola: 3rd: 81/106/94 Genetic screening:  normal Anatomy US: normal  Prenatal Transfer Tool  Maternal Diabetes: No  Genetic Screening: Normal Maternal Ultrasounds/Referrals: Normal Fetal Ultrasounds or other Referrals:  None Maternal Substance Abuse:  No Significant Maternal Medications:  None Significant Maternal Lab Results: Lab values include: Group B Strep positive  Results for orders placed or performed during the hospital encounter of 09/24/16 (from the past 24 hour(s))  CBC   Collection Time: 09/24/16  6:45 AM  Result Value Ref Range   WBC 7.2 4.0 - 10.5 K/uL   RBC 4.39 3.87 - 5.11 MIL/uL   Hemoglobin 10.6 (L) 12.0 - 15.0  g/dL   HCT 14.732.9 (L) 82.936.0 - 56.246.0 %   MCV 74.9 (L) 78.0 - 100.0 fL   MCH 24.1 (L) 26.0 - 34.0 pg   MCHC 32.2 30.0 - 36.0 g/dL   RDW 13.015.5 86.511.5 - 78.415.5 %   Platelets 191 150 - 400 K/uL  Type and screen   Collection Time: 09/24/16  6:45 AM  Result Value Ref Range   ABO/RH(D) B POS    Antibody Screen NEG    Sample Expiration 09/27/2016     Patient Active Problem List   Diagnosis Date Noted  . LGA (large for gestational age) fetus affecting management of mother 09/23/2016  . Supervision of high-risk pregnancy 09/17/2016  . Uterine size date discrepancy, antepartum, third trimester 08/04/2016  . GBS (group B Streptococcus carrier), +RV culture, currently pregnant 04/02/2016  . Late prenatal care affecting pregnancy in second trimester, antepartum 04/02/2016  . AMA (advanced maternal age) multigravida 35+ 02/28/2014  . Elderly multigravida with antepartum condition or complication 06/16/2011  . Family hx-consanguinity 06/16/2011    Assessment: Jacqueline [name] is a 41 y.o. G5P4004 at 2741w0d here for labor after SROM @0530 .   #Labor: expectant management for SVD #Pain: IV pain medications/epidural #FWB: Category I tracing #ID: GBS pos - PCN #MOF: breast/bottle #MOC: IUD paraguard  Renne Muscaaniel L Warden, MD PGY-1 09/24/2016, 9:18 AM    OB FELLOW HISTORY AND PHYSICAL ATTESTATION  I have seen and examined this patient; I agree with above documentation in the resident's note.    Jacqueline MowElizabeth Sandar Krinke, DO OB Fellow 09/24/2016, 10:48 PM

## 2016-09-24 NOTE — Progress Notes (Signed)
OB Interim Progress Note  S:Pain well controlled with epidural and has no complaints.  O: BP 109/63   Pulse 79   Temp 98.5 F (36.9 C) (Oral)   Resp 16   Ht 5\' 3"  (1.6 m)   Wt 75.4 kg (166 lb 4 oz)   LMP 12/19/2015   SpO2 99%   BMI 29.45 kg/m    Dilation: 6 Effacement (%): 60 Cervical Position: Middle Station: -2 Presentation: Vertex Exam by:: Mary SwazilandJordan Johnson, RN  Fetal monitoring: HR 140s, moderate variability, pos acels, no decels, contractions q562min  A/P: Feeling well, last cervical check was 6cm, 60% and patient now on pit at 4 making good progress. Continue expectant management Anticipate SVD  Renne Muscaaniel L Majesti Gambrell, MD 09/24/2016, 2:05 PM PGY-1

## 2016-09-24 NOTE — Anesthesia Preprocedure Evaluation (Signed)
Anesthesia Evaluation  Patient identified by MRN, date of birth, ID band Patient awake    Reviewed: Allergy & Precautions, NPO status , Patient's Chart, lab work & pertinent test results  Airway Mallampati: II  TM Distance: >3 FB Neck ROM: Full    Dental  (+) Teeth Intact, Dental Advisory Given   Pulmonary neg pulmonary ROS,    breath sounds clear to auscultation       Cardiovascular negative cardio ROS   Rhythm:Regular Rate:Normal     Neuro/Psych negative neurological ROS  negative psych ROS   GI/Hepatic Neg liver ROS, GERD  Medicated,  Endo/Other  negative endocrine ROS  Renal/GU negative Renal ROS  negative genitourinary   Musculoskeletal negative musculoskeletal ROS (+)   Abdominal   Peds negative pediatric ROS (+)  Hematology negative hematology ROS (+)   Anesthesia Other Findings   Reproductive/Obstetrics (+) Pregnancy                             Lab Results  Component Value Date   WBC 7.2 09/24/2016   HGB 10.6 (L) 09/24/2016   HCT 32.9 (L) 09/24/2016   MCV 74.9 (L) 09/24/2016   PLT 191 09/24/2016   No results found for: INR, PROTIME   Anesthesia Physical Anesthesia Plan  ASA: II  Anesthesia Plan: Epidural   Post-op Pain Management:    Induction:   Airway Management Planned:   Additional Equipment:   Intra-op Plan:   Post-operative Plan:   Informed Consent: I have reviewed the patients History and Physical, chart, labs and discussed the procedure including the risks, benefits and alternatives for the proposed anesthesia with the patient or authorized representative who has indicated his/her understanding and acceptance.     Plan Discussed with:   Anesthesia Plan Comments:         Anesthesia Quick Evaluation

## 2016-09-24 NOTE — MAU Note (Signed)
Pt reports ROM at 0515, some contractions.

## 2016-09-24 NOTE — Progress Notes (Signed)
Notified of pt arrival in MAU and SROM with positive fern. Pt is AM induction and orders are already in computer. Ok to release orders and admit to labor and delivery.

## 2016-09-25 ENCOUNTER — Encounter: Payer: Medicaid Other | Admitting: Certified Nurse Midwife

## 2016-09-25 MED ORDER — PRENATAL MULTIVITAMIN CH
1.0000 | ORAL_TABLET | Freq: Every day | ORAL | Status: DC
  Administered 2016-09-25 – 2016-09-26 (×2): 1 via ORAL
  Filled 2016-09-25 (×2): qty 1

## 2016-09-25 MED ORDER — SIMETHICONE 80 MG PO CHEW
80.0000 mg | CHEWABLE_TABLET | ORAL | Status: DC | PRN
Start: 2016-09-25 — End: 2016-09-26

## 2016-09-25 MED ORDER — PANTOPRAZOLE SODIUM 40 MG PO TBEC
40.0000 mg | DELAYED_RELEASE_TABLET | Freq: Every day | ORAL | Status: DC
  Administered 2016-09-26: 40 mg via ORAL
  Filled 2016-09-25 (×2): qty 1

## 2016-09-25 MED ORDER — OXYCODONE-ACETAMINOPHEN 5-325 MG PO TABS: 1.0000 | ORAL_TABLET | ORAL | Status: DC | PRN

## 2016-09-25 MED ORDER — OXYCODONE-ACETAMINOPHEN 5-325 MG PO TABS: 2.0000 | ORAL_TABLET | ORAL | Status: DC | PRN

## 2016-09-25 MED ORDER — ONDANSETRON HCL 4 MG PO TABS: 4.0000 mg | ORAL_TABLET | ORAL | Status: DC | PRN

## 2016-09-25 MED ORDER — ZOLPIDEM TARTRATE 5 MG PO TABS: 5.0000 mg | ORAL_TABLET | Freq: Every evening | ORAL | Status: DC | PRN

## 2016-09-25 MED ORDER — TETANUS-DIPHTH-ACELL PERTUSSIS 5-2.5-18.5 LF-MCG/0.5 IM SUSP: 0.5000 mL | Freq: Once | INTRAMUSCULAR | Status: DC

## 2016-09-25 MED ORDER — ACETAMINOPHEN 325 MG PO TABS
650.0000 mg | ORAL_TABLET | ORAL | Status: DC | PRN
  Administered 2016-09-25: 650 mg via ORAL
  Filled 2016-09-25: qty 2

## 2016-09-25 MED ORDER — DIBUCAINE 1 % RE OINT: 1.0000 "application " | TOPICAL_OINTMENT | RECTAL | Status: DC | PRN

## 2016-09-25 MED ORDER — DIPHENHYDRAMINE HCL 25 MG PO CAPS: 25.0000 mg | ORAL_CAPSULE | Freq: Four times a day (QID) | ORAL | Status: DC | PRN

## 2016-09-25 MED ORDER — COCONUT OIL OIL: 1.0000 "application " | TOPICAL_OIL | Status: DC | PRN

## 2016-09-25 MED ORDER — SENNOSIDES-DOCUSATE SODIUM 8.6-50 MG PO TABS
2.0000 | ORAL_TABLET | ORAL | Status: DC
  Administered 2016-09-25: 2 via ORAL
  Administered 2016-09-26: 1 via ORAL
  Filled 2016-09-25 (×2): qty 2

## 2016-09-25 MED ORDER — ONDANSETRON HCL 4 MG/2ML IJ SOLN: 4.0000 mg | INTRAMUSCULAR | Status: DC | PRN

## 2016-09-25 MED ORDER — WITCH HAZEL-GLYCERIN EX PADS: 1.0000 "application " | MEDICATED_PAD | CUTANEOUS | Status: DC | PRN

## 2016-09-25 MED ORDER — BENZOCAINE-MENTHOL 20-0.5 % EX AERO: 1.0000 "application " | INHALATION_SPRAY | CUTANEOUS | Status: DC | PRN

## 2016-09-25 NOTE — Progress Notes (Signed)
Post Partum Day #1 Subjective: up ad lib, voiding, tolerating PO and breast feeding is going well, increased cramping with feeding.  Objective: Blood pressure (!) 95/49, pulse (!) 57, temperature 98 F (36.7 C), temperature source Oral, resp. rate 16, height 5\' 3"  (1.6 m), weight 166 lb 4 oz (75.4 kg), last menstrual period 12/19/2015, SpO2 99 %, unknown if currently breastfeeding.  Physical Exam:  General: alert, cooperative and no distress Lochia: appropriate Uterine Fundus: firm Incision: none DVT Evaluation: No evidence of DVT seen on physical exam. No cords or calf tenderness. No significant calf/ankle edema.   Recent Labs  09/24/16 0645  HGB 10.6*  HCT 32.9*    Assessment/Plan: Plan for discharge tomorrow, Breastfeeding and Contraception Paragard   LOS: 1 day   Roe CoombsRachelle A Quinta Eimer, CNM 09/25/2016, 8:31 AM

## 2016-09-25 NOTE — Lactation Note (Signed)
This note was copied from a baby's chart. Lactation Consultation Note Initial visit at 24 hours of age.  [facility] when [facility], encouraged mom to check diaper and burp baby.  She patted baby and she quickly calmed to sleep.  Mom reports recent feeding of 25 minutes combined with both breasts.  Mom has experience with 4 older children nursing for 14-18 months.   This baby has already had a bottle of formula.  Mom reports older children were exclusively breast fed.  Mom reports she wants to give some formula because she will have to go to school.  MOm says she will need to return in about 4 months. LC encouraged mom to breast feed on demand to establish a good supply.  Lc advised of risks of formula feeding for mom and baby. Mom smiles and laughs nervously. LC instructed mom on hand expression of right breast, mom reports nipple is larger than left.  Several drops easily expressed.  Mom reports baby is not getting a deep latch on right breast as well as left. LC encouraged mom to call for assist as needed.  Mom to undress baby for feedings and wait for wide open mouth for deeper latching to prevent soreness and better transfer of milk. [facility] resources given and discussed.  Encouraged to feed with early cues on demand.  Early newborn behavior discussed.     Patient Name: Girl Ronnald Rampmal Jenifer ZOXWR'UToday's Date: 09/25/2016 Reason for consult: Initial assessment   Maternal Data Has patient been taught Hand Expression?: Yes Does the patient have breastfeeding experience prior to this delivery?: Yes  Feeding Feeding Type: Breast Fed Nipple Type: Slow - flow  LATCH Score/Interventions Latch: Grasps breast easily, tongue down, lips flanged, rhythmical sucking. Intervention(s): Adjust position;Assist with latch  Audible Swallowing: A few with stimulation  Type of Nipple: Everted at rest and after stimulation  Comfort (Breast/Nipple): Soft / non-tender     Hold (Positioning): Assistance needed to  correctly position infant at breast and maintain latch. Intervention(s): Breastfeeding basics reviewed  LATCH Score: 8  Lactation Tools Discussed/Used WIC Program: Yes   Consult Status Consult Status: Follow-up Date: 09/26/16 Follow-up type: In-patient    Beverely RisenShoptaw, Arvella MerlesJana Lynn 09/25/2016, 4:59 PM

## 2016-09-25 NOTE — Anesthesia Postprocedure Evaluation (Addendum)
Anesthesia Post Note  Patient: Jacqueline [name]  Procedure(s) Performed: * No procedures listed *  Patient location during evaluation: Mother Baby Anesthesia Type: Epidural Level of consciousness: awake and alert, oriented and patient cooperative Pain management: pain level controlled Vital Signs Assessment: post-procedure vital signs reviewed and stable Respiratory status: spontaneous breathing Cardiovascular status: stable Postop Assessment: no headache, epidural receding, patient able to bend at knees and no signs of nausea or vomiting Anesthetic complications: no Comments: Pain score 5 and pt states pain is manageable.         Last Vitals:  Vitals:   09/24/16 2330 09/25/16 0653  BP: (!) 104/56 (!) 95/49  Pulse: 65 (!) 57  Resp: 18 16  Temp: 36.7 C 36.7 C    Last Pain:  Vitals:   09/25/16 1254  TempSrc:   PainSc: 0-No pain   Pain Goal:                 [facility] DivisionWRINKLE,DANA

## 2016-09-25 NOTE — Progress Notes (Signed)
UR chart review completed.  

## 2016-09-26 MED ORDER — IBUPROFEN 600 MG PO TABS: 600.0000 mg | ORAL_TABLET | Freq: Four times a day (QID) | ORAL | 0 refills | Status: DC

## 2016-09-26 NOTE — Discharge Instructions (Signed)

## 2016-09-26 NOTE — Discharge Summary (Signed)
OB Discharge Summary     Patient Name: Jacqueline [name] DOB: Mar 14, 1976 MRN: [id number]  Date of admission: 09/24/2016 Delivering MD: Renne MuscaWARDEN, DANIEL L   Date of discharge: 09/26/2016  Admitting diagnosis: 40w, Water broke and Contractions  Intrauterine pregnancy: 6936w0d     Secondary diagnosis:  Active Problems:   LGA (large for gestational age) fetus affecting management of mother  Additional problems: None     Discharge diagnosis: Term Pregnancy Delivered                                                                                                Post partum procedures:none  Augmentation: none  Complications: None  Hospital course:  Onset of Labor With Vaginal Delivery     41 y.o. yo G5P5005 at 2436w0d was admitted in Active Labor on 09/24/2016. Patient had an uncomplicated labor course as follows:  Membrane Rupture Time/Date: 5:15 AM ,09/24/2016   Intrapartum Procedures: Episiotomy: None [1]                                         Lacerations:  1st degree [2]  Patient had a delivery of a Viable infant. 09/24/2016  Information for the patient's newborn:  Josie DixonMohamed, Girl Analy [[id number]][[id number]]  Delivery Method: Vaginal, Spontaneous Delivery (Filed from Delivery Summary)    Pateint had an uncomplicated postpartum course.  She is ambulating, tolerating a regular diet, passing flatus, and urinating well. Patient is discharged home in stable condition on 09/26/16.    Physical exam Vitals:   09/24/16 2330 09/25/16 0653 09/25/16 1935 09/26/16 0638  BP: (!) 104/56 (!) 95/49 (!) 91/45 (!) 112/54  Pulse: 65 (!) 57 62 (!) 52  Resp: 18 16 16 16   Temp: 98 F (36.7 C) 98 F (36.7 C) 98.1 F (36.7 C) 98.1 F (36.7 C)  TempSrc:  Oral Oral Oral  SpO2:      Weight:      Height:       General: alert, cooperative and no distress Lochia: appropriate Uterine Fundus: firm Incision: N/A DVT Evaluation: No evidence of DVT seen on physical exam. Negative Homan's sign. No cords or calf  tenderness. No significant calf/ankle edema. Labs: Lab Results  Component Value Date   WBC 7.2 09/24/2016   HGB 10.6 (L) 09/24/2016   HCT 32.9 (L) 09/24/2016   MCV 74.9 (L) 09/24/2016   PLT 191 09/24/2016   No flowsheet data found.  Discharge instruction: per After Visit Summary and "Baby and Me Booklet".  After visit meds:  Allergies as of 09/26/2016   No Known Allergies     Medication List    TAKE these medications   ibuprofen 600 MG tablet Commonly known as:  ADVIL,MOTRIN Take 1 tablet (600 mg total) by mouth every 6 (six) hours.   omeprazole 20 MG capsule Commonly known as:  PRILOSEC Take 1 capsule (20 mg total) by mouth 2 (two) times daily before a meal.   PRENATE PIXIE 10-0.6-0.4-200 MG Caps Take 1 tablet by mouth daily.  Diet: routine diet  Activity: Advance as tolerated. Pelvic rest for 6 weeks.   Outpatient follow up:6 weeks Follow up Appt:No future appointments. Follow up Visit:No Follow-up on file.  Postpartum contraception: paraguard  Newborn Data: Live born female  Birth Weight: 8 lb 9 oz (3884 g) APGAR: 8, 9  Baby Feeding: Breast Disposition:home with mother   09/26/2016 Beaulah Dinning, MD  OB FELLOW DISCHARGE ATTESTATION  I agree with above documentation in the resident's note.   Ernestina Penna, MD 3:28 PM

## 2016-09-26 NOTE — Lactation Note (Signed)
This note was copied from a baby's chart. Lactation Consultation Note  Patient Name: Girl Ronnald Rampmal Bambach ZOXWR'UToday's Date: 09/26/2016 Reason for consult: Follow-up assessment Mom is experienced BF and reports this baby is nursing well. Denies questions/concerns or need for assist with latch. Hand pump given for home use prn, flange changed to 27. Engorgement care reviewed if needed, advised of OP services and support group. Encouraged to call as needed.   Maternal Data    Feeding    LATCH Score/Interventions                      Lactation Tools Discussed/Used Tools: Pump;Flanges Flange Size: 27 Breast pump type: Manual   Consult Status Consult Status: Complete Date: 09/26/16 Follow-up type: In-patient    Alfred LevinsGranger, Ted Goodner Ann 09/26/2016, 12:23 PM

## 2016-09-29 ENCOUNTER — Other Ambulatory Visit: Payer: Medicaid Other

## 2016-10-02 ENCOUNTER — Encounter: Payer: Medicaid Other | Admitting: Certified Nurse Midwife

## 2017-02-12 NOTE — Addendum Note (Signed)
Addendum  created 02/12/17 0847 by Shelton SilvasHollis, Sabas Frett D, MD   Sign clinical note

## 2017-07-21 IMAGING — US US MFM FETAL BPP W/O NON-STRESS
1 series · 12 of 28 positions shown · non-contrast
Comparison: none

[Series 1: us mfm fetal bpp w/o non-stress · 32 acquisitions, 12 frames shown]
[im 2/32]
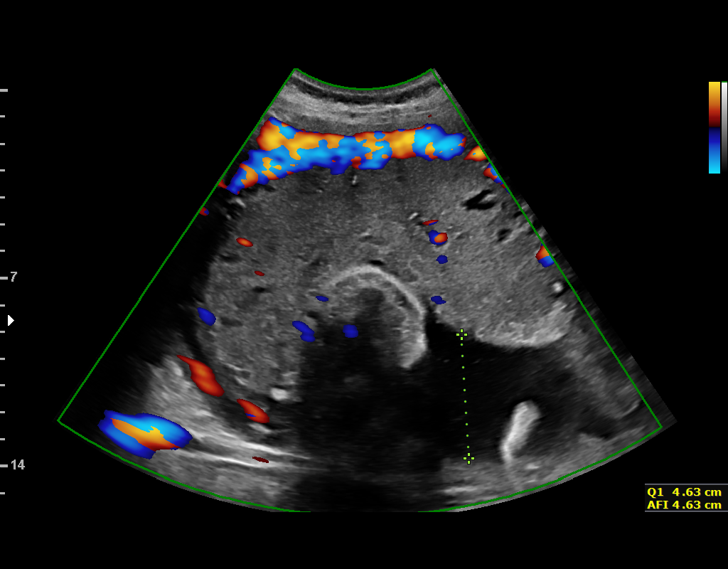
[im 4/32]
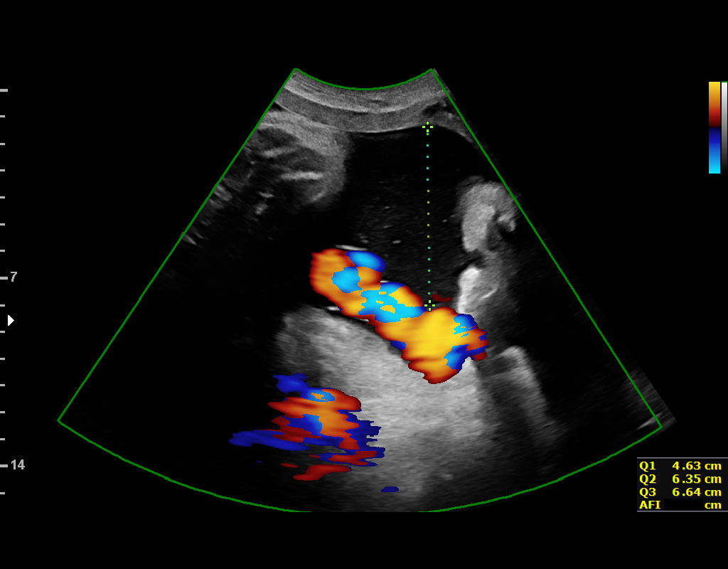
[im 6/32]
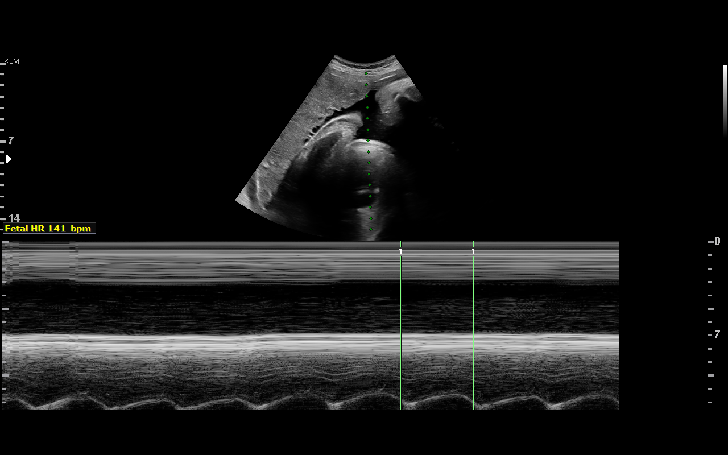
[im 10/32]
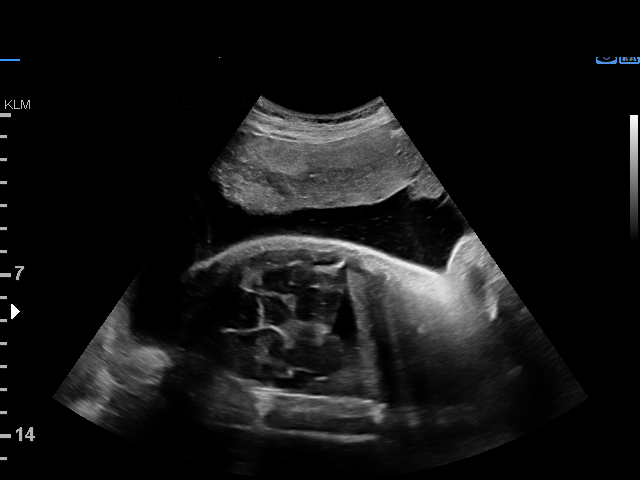
[im 12/32]
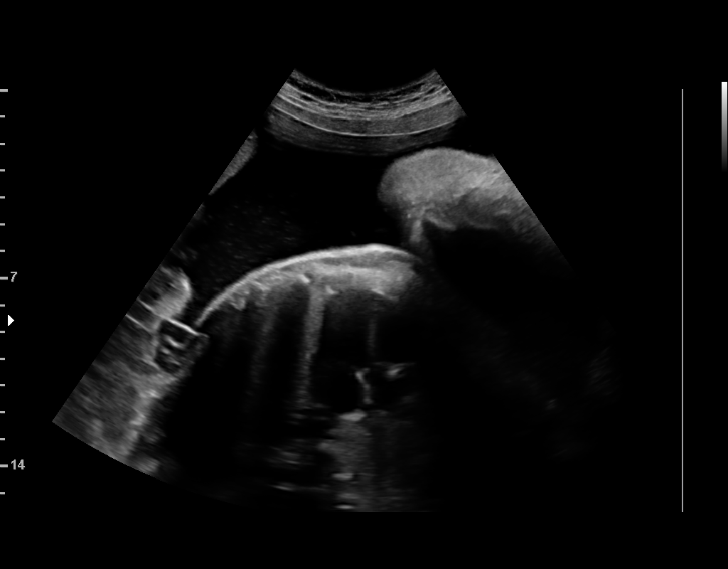
[im 14/32]
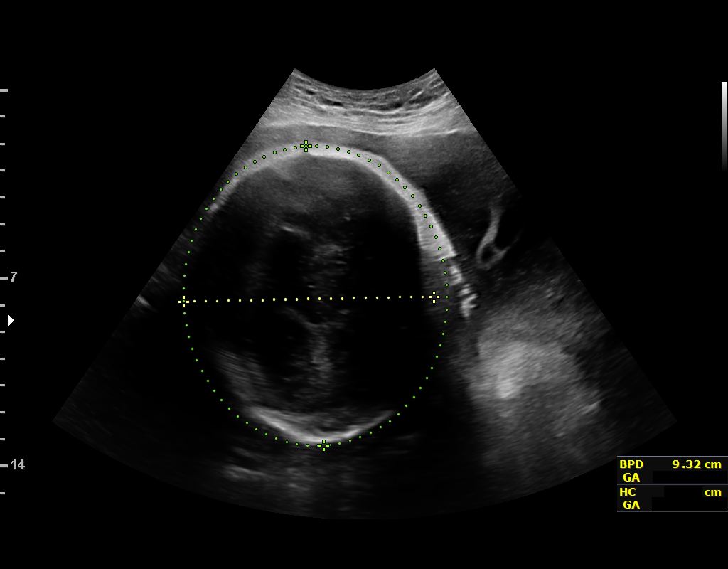
[im 18/32]
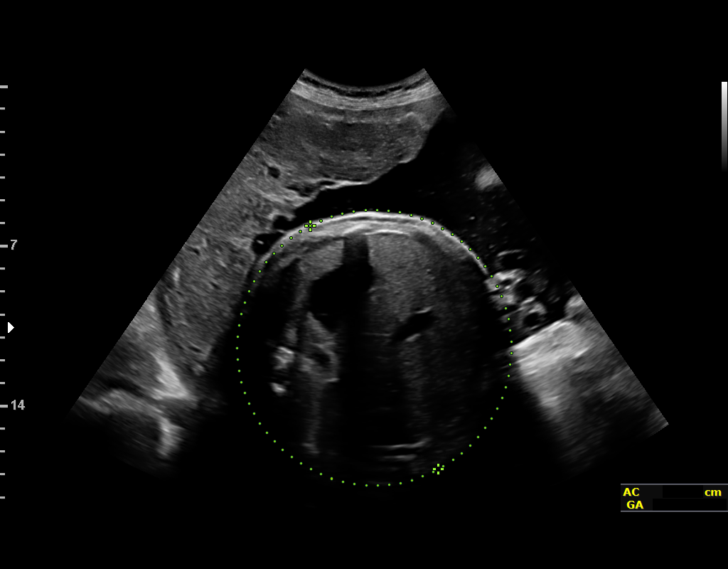
[im 20/32]
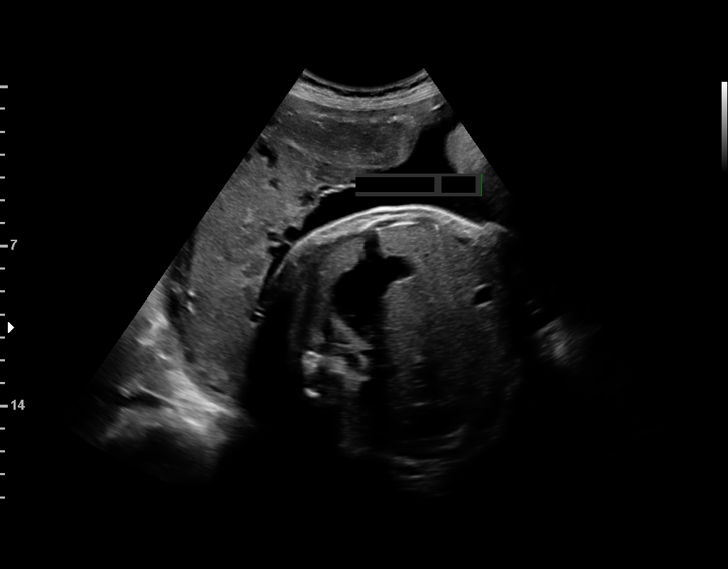
[im 22/32]
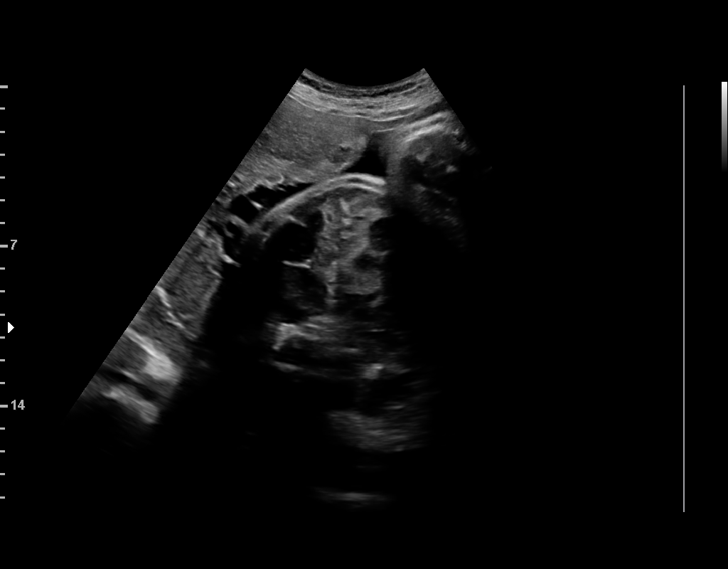
[im 26/32]
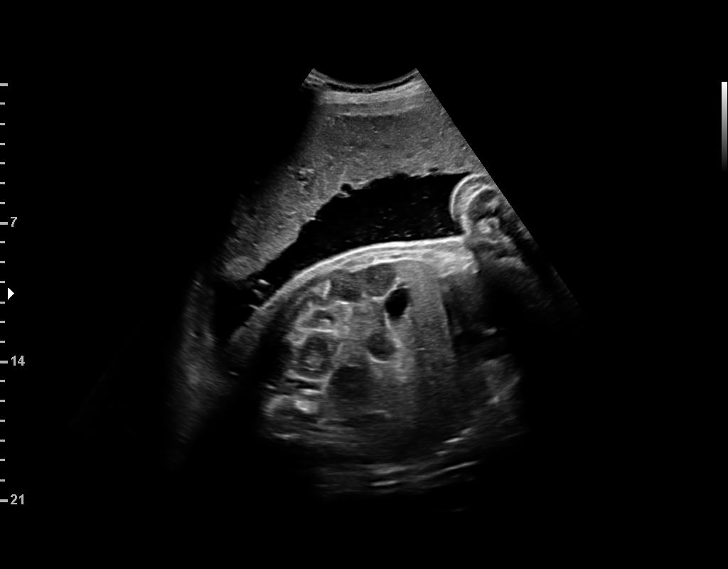
[im 28/32]
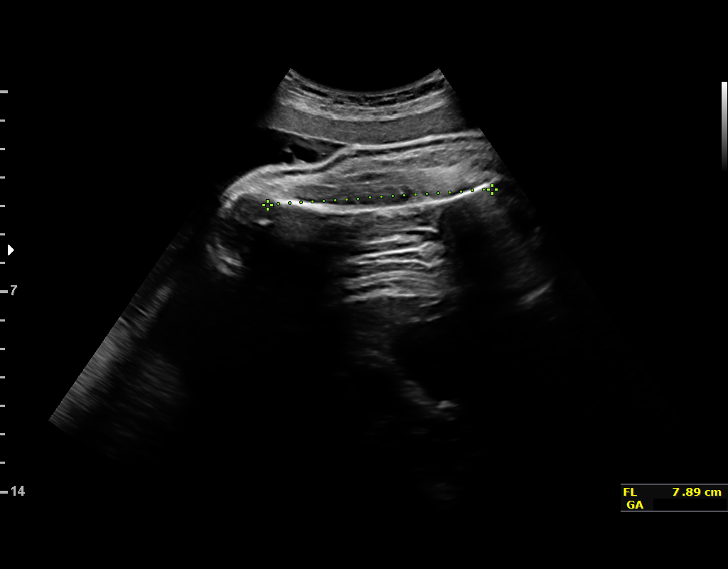
[im 30/32]
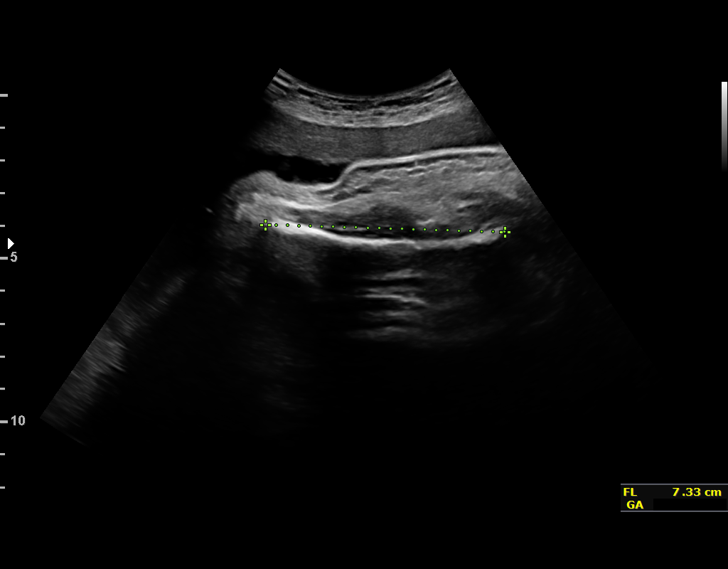

[12 of 28 positions shown; findings below may reference images not displayed]

Road [HOSPITAL]

Indications

39 weeks gestation of pregnancy
Advanced maternal age multigravida 35+
(40), third trimester - low risk NIPS
OB History

Gravidity:    5         Term:   4        Prem:   0        SAB:   0
TOP:          0       Ectopic:  0        Living: 4
Fetal Evaluation

Num Of Fetuses:     1
Fetal Heart         141
Rate(bpm):
Cardiac Activity:   Observed
Presentation:       Cephalic
Placenta:           Fundal, above cervical os

Amniotic Fluid
AFI FV:      Subjectively upper-normal

AFI Sum(cm)     %Tile       Largest Pocket(cm)
24.21           97

RUQ(cm)       RLQ(cm)       LUQ(cm)        LLQ(cm)
4.63

Comment:    [DATE] BPP in 12 weeks.
Biophysical Evaluation

Amniotic F.V:   Within normal limits       F. Tone:        Observed
F. Movement:    Observed                   Score:          [DATE]
F. Breathing:   Observed
Biometry

BPD:      91.7  mm     G. Age:  37w 2d         24  %    CI:        77.43   %   70 - 86
FL/HC:      23.3   %   20.7 -
HC:      329.9  mm     G. Age:  37w 4d          7  %    HC/AC:      0.87       0.87 -
AC:      379.1  mm     G. Age:  41w 6d       > 97  %    FL/BPD:     83.8   %   71 - 87
FL:       76.8  mm     G. Age:  39w 2d         49  %    FL/AC:      20.3   %   20 - 24
Est. FW:    4336  gm    8 lb 13 oz    > 90  %
Gestational Age

LMP:           39w 5d       Date:   12/19/15                 EDD:   09/24/16
U/S Today:     39w 0d                                        EDD:   09/29/16
Best:          39w 5d    Det. By:   LMP  (12/19/15)          EDD:   09/24/16
Anatomy

Cranium:               Appears normal         Aortic Arch:            Previously seen
Cavum:                 Previously seen        Ductal Arch:            Previously seen
Ventricles:            Previously seen        Diaphragm:              Previously seen
Choroid Plexus:        Previously seen        Stomach:                Appears normal, left
sided
Cerebellum:            Appears normal         Abdomen:                Appears normal
Posterior Fossa:       Appears normal         Abdominal Wall:         Previously seen
Nuchal Fold:           Previously seen        Cord Vessels:           Previously seen
Face:                  Orbits and profile     Kidneys:                Appear normal
previously seen
Lips:                  Previously seen        Bladder:                Appears normal
Thoracic:              Appears normal         Spine:                  Previously seen
Heart:                 Appears normal         Upper Extremities:      Previously seen
(4CH, axis, and situs
RVOT:                  Previously seen        Lower Extremities:      Previously seen
LVOT:                  Appears normal

Other:  Fetus appears to be a female. Heels prev visualized. Nasal bone prev
visualized. Technically difficult due to fetal position.
Cervix Uterus Adnexa

Cervix
Not visualized (advanced GA >92wks)
Impression

SIUP at 39+5 weeks
Cephalic presentation
Normal interval anatomy; anatomic survey complete
High normal amniotic fluid volume
EFW > 90th %tile; AC > 97th %tile; 4336 grams; 8+13
BPP [DATE]
Recommendations

Induction planned for [REDACTED]

## 2018-08-28 ENCOUNTER — Emergency Department (HOSPITAL_BASED_OUTPATIENT_CLINIC_OR_DEPARTMENT_OTHER)
Admission: EM | Admit: 2018-08-28 | Discharge: 2018-08-28 | Disposition: A | Discharge status: Home / Self Care | Payer: Self-pay | Attending: Emergency Medicine | Admitting: Emergency Medicine

## 2018-08-28 ENCOUNTER — Other Ambulatory Visit: Payer: Self-pay

## 2018-08-28 ENCOUNTER — Emergency Department (HOSPITAL_BASED_OUTPATIENT_CLINIC_OR_DEPARTMENT_OTHER): Payer: Self-pay

## 2018-08-28 ENCOUNTER — Encounter (HOSPITAL_BASED_OUTPATIENT_CLINIC_OR_DEPARTMENT_OTHER): Payer: Self-pay | Admitting: Emergency Medicine

## 2018-08-28 DIAGNOSIS — D509 Iron deficiency anemia, unspecified: Secondary | ICD-10-CM | POA: Insufficient documentation

## 2018-08-28 DIAGNOSIS — E876 Hypokalemia: Secondary | ICD-10-CM | POA: Insufficient documentation

## 2018-08-28 DIAGNOSIS — Z79899 Other long term (current) drug therapy: Secondary | ICD-10-CM | POA: Insufficient documentation

## 2018-08-28 DIAGNOSIS — R000 Tachycardia, unspecified: Secondary | ICD-10-CM | POA: Insufficient documentation

## 2018-08-28 LAB — CBC
HCT: 35 % — ABNORMAL LOW (ref 36.0–46.0)
HEMOGLOBIN: 10.8 g/dL — AB (ref 12.0–15.0)
MCH: 24 pg — AB (ref 26.0–34.0)
MCHC: 30.9 g/dL (ref 30.0–36.0)
MCV: 77.8 fL — AB (ref 80.0–100.0)
PLATELETS: 299 10*3/uL (ref 150–400)
RBC: 4.5 MIL/uL (ref 3.87–5.11)
RDW: 15.4 % (ref 11.5–15.5)
WBC: 10.5 10*3/uL (ref 4.0–10.5)
nRBC: 0 % (ref 0.0–0.2)

## 2018-08-28 LAB — URINALYSIS, ROUTINE W REFLEX MICROSCOPIC
Bilirubin Urine: NEGATIVE
Glucose, UA: NEGATIVE mg/dL
Ketones, ur: NEGATIVE mg/dL
LEUKOCYTES UA: NEGATIVE
NITRITE: NEGATIVE
PH: 5.5 (ref 5.0–8.0)
Protein, ur: NEGATIVE mg/dL

## 2018-08-28 LAB — BASIC METABOLIC PANEL
ANION GAP: 9 (ref 5–15)
BUN: 14 mg/dL (ref 6–20)
CALCIUM: 8.9 mg/dL (ref 8.9–10.3)
CO2: 22 mmol/L (ref 22–32)
Chloride: 106 mmol/L (ref 98–111)
Creatinine, Ser: 0.65 mg/dL (ref 0.44–1.00)
GFR calc Af Amer: >60 mL/min (ref >60)
GFR calc non Af Amer: >60 mL/min (ref >60)
GLUCOSE: 130 mg/dL — AB (ref 70–99)
Potassium: 2.9 mmol/L — ABNORMAL LOW (ref 3.5–5.1)
Sodium: 137 mmol/L (ref 135–145)

## 2018-08-28 LAB — URINALYSIS, MICROSCOPIC (REFLEX)

## 2018-08-28 LAB — PREGNANCY, URINE: Preg Test, Ur: NEGATIVE

## 2018-08-28 LAB — TROPONIN I: Troponin I: <0.03 ng/mL (ref <0.03)

## 2018-08-28 LAB — D-DIMER, QUANTITATIVE: D-Dimer, Quant: <0.27 ug/mL-FEU (ref 0.00–0.50)

## 2018-08-28 MED ORDER — POTASSIUM CHLORIDE CRYS ER 20 MEQ PO TBCR: 20.0000 meq | EXTENDED_RELEASE_TABLET | Freq: Two times a day (BID) | ORAL | 0 refills | Status: AC

## 2018-08-28 MED ORDER — POTASSIUM CHLORIDE CRYS ER 20 MEQ PO TBCR
40.0000 meq | EXTENDED_RELEASE_TABLET | Freq: Once | ORAL | Status: AC
  Administered 2018-08-28: 40 meq via ORAL
  Filled 2018-08-28: qty 2

## 2018-08-28 MED ORDER — FERROUS SULFATE 325 (65 FE) MG PO TABS: 325.0000 mg | ORAL_TABLET | Freq: Two times a day (BID) | ORAL | 0 refills | Status: AC

## 2018-08-28 NOTE — ED Provider Notes (Signed)
[facility] Provider Note: Lowella Dell, MD, FACEP  CSN: [id number] MRN: [id number] ARRIVAL: 08/28/18 at 0013 ROOM: MH09/MH09   CHIEF COMPLAINT  Palpitations   HISTORY OF PRESENT ILLNESS  08/28/18 5:59 AM Jacqueline [name] is a 42 y.o. female with a sensation that her heart is beating rapidly.  This began about 1 hour prior to arrival.  She has had episodes over the past year.  She denies chest pain but states it feels like she is having difficulty taking a deep breath.  She denies nausea or vomiting.  Nothing seems to make the symptoms better or worse.   Past Medical History:  Diagnosis Date  . Medical history non-contributory   . No pertinent past medical history     Past Surgical History:  Procedure Laterality Date  . NO PAST SURGERIES      Family History  Problem Relation Age of Onset  . Diabetes Father   . Hypertension Father   . Hypertension Mother     Social History   Tobacco Use  . Smoking status: Never Smoker  . Smokeless tobacco: Never Used  Substance Use Topics  . Alcohol use: No  . Drug use: No    Prior to Admission medications   Medication Sig Start Date End Date Taking? Authorizing Provider  ibuprofen (ADVIL,MOTRIN) 600 MG tablet Take 1 tablet (600 mg total) by mouth every 6 (six) hours. 09/26/16   Beaulah Dinning, MD  omeprazole (PRILOSEC) 20 MG capsule Take 1 capsule (20 mg total) by mouth 2 (two) times daily before a meal. 05/13/16   Denney, Rachelle A, CNM  Prenat-FeAsp-Meth-FA-DHA w/o A (PRENATE PIXIE) 10-0.6-0.4-200 MG CAPS Take 1 tablet by mouth daily. 04/02/16   Roe Coombs, CNM    Allergies Patient has no known allergies.   REVIEW OF SYSTEMS  Negative except as noted here or in the History of Present Illness.   PHYSICAL EXAMINATION  Initial Vital Signs Blood pressure 121/66, pulse 80, temperature 98.5 F (36.9 C), temperature source Oral, resp. rate 18, weight 75.4 kg, last menstrual period 08/07/2018, SpO2 100  %, unknown if currently breastfeeding.  Examination General: Well-developed, well-nourished female in no acute distress; appearance consistent with age of record HENT: normocephalic; atraumatic Eyes: pupils equal, round and reactive to light; extraocular muscles intact Neck: supple Heart: regular rate and rhythm; no murmur Lungs: clear to auscultation bilaterally Abdomen: soft; nondistended; nontender; no masses or hepatosplenomegaly; bowel sounds present Extremities: No deformity; full range of motion; pulses normal Neurologic: Awake, alert and oriented; motor function intact in all extremities and symmetric; no facial droop Skin: Warm and dry Psychiatric: Normal mood and affect   RESULTS  Summary of this visit's results, reviewed by myself:   EKG Interpretation  Date/Time:  Sunday August 28 2018 00:32:54 EST Ventricular Rate:  124 PR Interval:    QRS Duration: 96 QT Interval:  311 QTC Calculation: 447 R Axis:   -58 Text Interpretation:  Sinus tachycardia LAD, consider left anterior fascicular block Nonspecific T abnormalities, diffuse leads No previous ECGs available Confirmed by Maxamus Colao, Jonny Ruiz ([id number]) on 08/28/2018 12:42:44 AM      Laboratory Studies: Results for orders placed or performed during the hospital encounter of 08/28/18 (from the past 24 hour(s))  Basic metabolic panel     Status: Abnormal   Collection Time: 08/28/18 12:39 AM  Result Value Ref Range   Sodium 137 135 - 145 mmol/L   Potassium 2.9 (L) 3.5 - 5.1 mmol/L   Chloride 106 98 -  111 mmol/L   CO2 22 22 - 32 mmol/L   Glucose, Bld 130 (H) 70 - 99 mg/dL   BUN 14 6 - 20 mg/dL   Creatinine, Ser 2.950.65 0.44 - 1.00 mg/dL   Calcium 8.9 8.9 - 62.110.3 mg/dL   GFR calc non Af Amer >60 >60 mL/min   GFR calc Af Amer >60 >60 mL/min   Anion gap 9 5 - 15  CBC     Status: Abnormal   Collection Time: 08/28/18 12:39 AM  Result Value Ref Range   WBC 10.5 4.0 - 10.5 K/uL   RBC 4.50 3.87 - 5.11 MIL/uL   Hemoglobin 10.8  (L) 12.0 - 15.0 g/dL   HCT 30.835.0 (L) 65.736.0 - 84.646.0 %   MCV 77.8 (L) 80.0 - 100.0 fL   MCH 24.0 (L) 26.0 - 34.0 pg   MCHC 30.9 30.0 - 36.0 g/dL   RDW 96.215.4 95.211.5 - 84.115.5 %   Platelets 299 150 - 400 K/uL   nRBC 0.0 0.0 - 0.2 %  Troponin I - ONCE - STAT     Status: None   Collection Time: 08/28/18 12:39 AM  Result Value Ref Range   Troponin I <0.03 <0.03 ng/mL  D-dimer, quantitative (not at [facility])     Status: None   Collection Time: 08/28/18 12:39 AM  Result Value Ref Range   D-Dimer, Quant <0.27 0.00 - 0.50 ug/mL-FEU  Pregnancy, urine     Status: None   Collection Time: 08/28/18  1:33 AM  Result Value Ref Range   Preg Test, Ur NEGATIVE NEGATIVE  Urinalysis, Routine w reflex microscopic     Status: Abnormal   Collection Time: 08/28/18  1:34 AM  Result Value Ref Range   Color, Urine YELLOW YELLOW   APPearance CLEAR CLEAR   Specific Gravity, Urine <1.005 (L) 1.005 - 1.030   pH 5.5 5.0 - 8.0   Glucose, UA NEGATIVE NEGATIVE mg/dL   Hgb urine dipstick SMALL (A) NEGATIVE   Bilirubin Urine NEGATIVE NEGATIVE   Ketones, ur NEGATIVE NEGATIVE mg/dL   Protein, ur NEGATIVE NEGATIVE mg/dL   Nitrite NEGATIVE NEGATIVE   Leukocytes, UA NEGATIVE NEGATIVE  Urinalysis, Microscopic (reflex)     Status: Abnormal   Collection Time: 08/28/18  1:34 AM  Result Value Ref Range   RBC / HPF 0-5 0 - 5 RBC/hpf   WBC, UA 0-5 0 - 5 WBC/hpf   Bacteria, UA RARE (A) NONE SEEN   Squamous Epithelial / LPF 0-5 0 - 5   Imaging Studies: Dg Chest 2 View  Result Date: 08/28/2018 CLINICAL DATA:  Shortness of breath. Chest pain. EXAM: CHEST - 2 VIEW COMPARISON:  01/08/2009 FINDINGS: The cardiomediastinal contours are normal. The lungs are clear. Pulmonary vasculature is normal. No consolidation, pleural effusion, or pneumothorax. No acute osseous abnormalities are seen. IMPRESSION: No acute pulmonary process. Electronically Signed   By: Narda RutherfordMelanie  Sanford M.D.   On: 08/28/2018 01:24    ED COURSE and MDM  Nursing notes and  initial vitals signs, including pulse oximetry, reviewed.  Vitals:   08/28/18 0230 08/28/18 0245 08/28/18 0300 08/28/18 0315  BP:      Pulse: 88 85 88 80  Resp: 18 20 17 18   Temp:      TempSrc:      SpO2: 100% 100% 98% 100%  Weight:       The patient arrived in sinus tachycardia which resolved in the ED.  She is now asymptomatic with no difficulty breathing or chest  pain.  She was advised of her low potassium and we will give her supplements.  We will also provide an iron supplement for her microcytic anemia.  PROCEDURES    ED DIAGNOSES     ICD-10-CM   1. Sinus tachycardia R00.0   2. Hypokalemia E87.6   3. Microcytic anemia D50.9        Kaya Klausing, MD 08/28/18 [phone]

## 2018-08-28 NOTE — ED Notes (Signed)
MD with pt  

## 2018-08-28 NOTE — ED Triage Notes (Signed)
Patient states that she is having SOB and palpitations for about 1 hour  - patient denies a hx of heart problems in the past

## 2019-09-27 ENCOUNTER — Ambulatory Visit: Payer: Medicaid Other | Admitting: Family Medicine

## 2019-10-24 ENCOUNTER — Ambulatory Visit: Payer: Self-pay | Attending: Internal Medicine | Admitting: Internal Medicine

## 2019-10-24 ENCOUNTER — Other Ambulatory Visit: Payer: Self-pay

## 2019-10-24 ENCOUNTER — Encounter: Payer: Self-pay | Admitting: Internal Medicine

## 2019-10-24 DIAGNOSIS — M79672 Pain in left foot: Secondary | ICD-10-CM

## 2019-10-24 NOTE — Progress Notes (Signed)
Pt states she is having some edema in her left leg

## 2019-10-24 NOTE — Progress Notes (Signed)
Virtual Visit via Telephone Note Due to current restrictions/limitations of in-office visits due to the COVID-19 pandemic, this scheduled clinical appointment was converted to a telehealth visit  I connected with Jacqueline [name] on 10/24/19 at 3:21 p.m by telephone and verified that I am speaking with the correct person using two identifiers. I am in my office.  The patient is at home.  Only the patient and myself participated in this encounter.  I discussed the limitations, risks, security and privacy concerns of performing an evaluation and management service by telephone and the availability of in person appointments. I also discussed with the patient that there may be a patient responsible charge related to this service. The patient expressed understanding and agreed to proceed.   History of Present Illness: This is new pt visit. No previous PCP. No chronic medical issues.  Not on any meds  Pt c/o pain in 3-5th toes of LT foot x 3 mths.  Worse when she does a lot of walking and sometimes standing. +numbness intermittently in the toes. + swelling in the entire foot that is there most of the time but worse with prolong standing. No redness or color changes to foot or toes. Takes Tylenol PRN which helps sometimes. Does not have to take it every day.  -no other jts involved. -no known injury.  Past family, social, surgical history is reviewed and updated in the system.  Social History   Tobacco Use  . Smoking status: Never Smoker  . Smokeless tobacco: Never Used  Substance Use Topics  . Alcohol use: No  . Drug use: No    Observations/Objective: No direct observation done as this was a telephone encounter.  Assessment and Plan: 1. Left foot pain Questionable etiology. -We will give patient the next available in person visit for me to examine the foot and come up with diagnosis and plan.  Patient is agreeable to this   Follow Up Instructions: Next available in-person visit   I  discussed the assessment and treatment plan with the patient. The patient was provided an opportunity to ask questions and all were answered. The patient agreed with the plan and demonstrated an understanding of the instructions.   The patient was advised to call back or seek an in-person evaluation if the symptoms worsen or if the condition fails to improve as anticipated.  I provided 11 minutes of non-face-to-face time during this encounter.   Jonah Blue, MD

## 2019-10-30 ENCOUNTER — Ambulatory Visit: Payer: Self-pay | Attending: Internal Medicine

## 2019-10-30 ENCOUNTER — Ambulatory Visit: Payer: Medicaid Other

## 2019-10-30 ENCOUNTER — Other Ambulatory Visit: Payer: Self-pay

## 2019-11-02 ENCOUNTER — Ambulatory Visit: Payer: Medicaid Other | Admitting: Internal Medicine

## 2019-11-17 ENCOUNTER — Ambulatory Visit: Payer: Self-pay | Attending: Internal Medicine | Admitting: Family

## 2019-11-17 ENCOUNTER — Other Ambulatory Visit: Payer: Self-pay

## 2019-11-17 VITALS — BP 116/79 | HR 77 | Temp 98.4°F | Resp 16 | Ht 64.5 in | Wt 173.2 lb

## 2019-11-17 DIAGNOSIS — R202 Paresthesia of skin: Secondary | ICD-10-CM

## 2019-11-17 DIAGNOSIS — R200 Anesthesia of skin: Secondary | ICD-10-CM

## 2019-11-17 NOTE — Progress Notes (Signed)
Patient ID: Jacqueline [name], female    DOB: 1975-12-26  MRN: [id number]  CC: foot exam  Subjective: Jacqueline [name] is a 44 y.o. female with history of group B strep while pregnant, advanced maternal age multigravida 35+, late prenatal care affecting pregnancy in second trimester, uterine size date discrepancy, and large for gestational age fetus who presents for left foot pain.   1. LEFT FOOT PAIN FOLLOW-UP:  Left foot pain located at the forefoot and the fourth and fifth toe. Rates pain 4 out of 10 that comes and goes. Cannot recall when foot pain began. Foot pain noticeable especially when standing for a long time or while wearing dress shoes. Has tried Tylenol for pain management which helps. Has numbness and tingling. Able to do normal activities of living. Has left foot swelling occaionally. Denies trauma, injury, and ambulation assistive devices. Right foot has numbness and tingling sometimes but not often.  Patient Active Problem List   Diagnosis Date Noted  . LGA (large for gestational age) fetus affecting management of mother 09/23/2016  . Supervision of high-risk pregnancy 09/17/2016  . Uterine size date discrepancy, antepartum, third trimester 08/04/2016  . GBS (group B Streptococcus carrier), +RV culture, currently pregnant 04/02/2016  . Late prenatal care affecting pregnancy in second trimester, antepartum 04/02/2016  . AMA (advanced maternal age) multigravida 35+ 02/28/2014  . Elderly multigravida with antepartum condition or complication 06/16/2011  . Family hx-consanguinity 06/16/2011     Current Outpatient Medications on File Prior to Visit  Medication Sig Dispense Refill  . ferrous sulfate 325 (65 FE) MG tablet Take 1 tablet (325 mg total) by mouth 2 (two) times daily with a meal. (Patient not taking: Reported on 10/24/2019) 60 tablet 0  . potassium chloride SA (K-DUR,KLOR-CON) 20 MEQ tablet Take 1 tablet (20 mEq total) by mouth 2 (two) times daily. (Patient not taking:  Reported on 10/24/2019) 10 tablet 0   No current facility-administered medications on file prior to visit.    No Known Allergies  Social History   Socioeconomic History  . Marital status: Married    Spouse name: Not on file  . Number of children: 5  . Years of education: Not on file  . Highest education level: Master's degree (e.g., MA, MS, MEng, MEd, MSW, MBA)  Occupational History  . Occupation: unemployed  Tobacco Use  . Smoking status: Never Smoker  . Smokeless tobacco: Never Used  Substance and Sexual Activity  . Alcohol use: No  . Drug use: No  . Sexual activity: Not Currently    Birth control/protection: None  Other Topics Concern  . Not on file  Social History Narrative   ** Merged History Encounter **       Social Determinants of Health   Financial Resource Strain:   . Difficulty of Paying Living Expenses: Not on file  Food Insecurity:   . Worried About Programme researcher, broadcasting/film/video in the Last Year: Not on file  . Ran Out of Food in the Last Year: Not on file  Transportation Needs:   . Lack of Transportation (Medical): Not on file  . Lack of Transportation (Non-Medical): Not on file  Physical Activity:   . Days of Exercise per Week: Not on file  . Minutes of Exercise per Session: Not on file  Stress:   . Feeling of Stress : Not on file  Social Connections:   . Frequency of Communication with Friends and Family: Not on file  . Frequency of Social Gatherings with  Friends and Family: Not on file  . Attends Religious Services: Not on file  . Active Member of Clubs or Organizations: Not on file  . Attends Banker Meetings: Not on file  . Marital Status: Not on file  Intimate Partner Violence:   . Fear of Current or Ex-Partner: Not on file  . Emotionally Abused: Not on file  . Physically Abused: Not on file  . Sexually Abused: Not on file    Family History  Problem Relation Age of Onset  . Diabetes Father   . Hypertension Father   . Hypertension  Mother     Past Surgical History:  Procedure Laterality Date  . NO PAST SURGERIES      ROS: Review of Systems  Respiratory: Negative for shortness of breath and wheezing.   Cardiovascular: Negative for chest pain.  Negative except as stated above  PHYSICAL EXAM: BP 116/79   Pulse 77   Temp 98.4 F (36.9 C)   Resp 16   Ht 5' 4.5" (1.638 m)   Wt 173 lb 3.2 oz (78.6 kg)   SpO2 100%   BMI 29.27 kg/m   Physical Exam General appearance - alert, well appearing, and in no distress and oriented to person, place, and time Mental status - alert, oriented to person, place, and time, normal mood, behavior, speech, dress, motor activity, and thought processes Chest - clear to auscultation, no wheezes, rales or rhonchi, symmetric air entry, no tachypnea, retractions or cyanosis Heart - normal rate, regular rhythm, normal S1, S2, no murmurs, rubs, clicks or gallops Musculoskeletal - no joint tenderness, deformity or swelling, no muscular tenderness noted Extremities - peripheral pulses normal, no pedal edema, no clubbing or cyanosis, intact peripheral pulses, feet normal, good pulses, normal color, temperature and sensation, monofilament sensory exam is normal in both feet  CMP Latest Ref Rng & Units 08/28/2018  Glucose 70 - 99 mg/dL 751(W)  BUN 6 - 20 mg/dL 14  Creatinine 2.58 - 5.27 mg/dL 7.82  Sodium 423 - 536 mmol/L 137  Potassium 3.5 - 5.1 mmol/L 2.9(L)  Chloride 98 - 111 mmol/L 106  CO2 22 - 32 mmol/L 22  Calcium 8.9 - 10.3 mg/dL 8.9   Lipid Panel  No results found for: CHOL, TRIG, HDL, CHOLHDL, VLDL, LDLCALC, LDLDIRECT  CBC    Component Value Date/Time   WBC 10.5 08/28/2018 0039   RBC 4.50 08/28/2018 0039   HGB 10.8 (L) 08/28/2018 0039   HGB 10.4 (L) 07/02/2016 1120   HCT 35.0 (L) 08/28/2018 0039   HCT 31.7 (L) 07/02/2016 1120   PLT 299 08/28/2018 0039   PLT 182 07/02/2016 1120   MCV 77.8 (L) 08/28/2018 0039   MCV 83 07/02/2016 1120   MCH 24.0 (L) 08/28/2018 0039    MCHC 30.9 08/28/2018 0039   RDW 15.4 08/28/2018 0039   RDW 13.9 07/02/2016 1120   LYMPHSABS 1.2 04/02/2016 1504   MONOABS 0.4 08/31/2013 1043   EOSABS 0.0 04/02/2016 1504   BASOSABS 0.0 04/02/2016 1504    ASSESSMENT AND PLAN: 1. Numbness and tingling of foot: - Vitamin B12 - Hemoglobin A1c -Will screen for diabetes and vitamin B12 deficiency as a result of left foot tingling and numbness.   Patient was given the opportunity to ask questions.  Patient verbalized understanding of the plan and was able to repeat key elements of the plan. Patient was given clear instructions to go to Emergency Department or return to medical center if symptoms don't improve, worsen, or  new problems develop.The patient verbalized understanding.  No orders of the defined types were placed in this encounter.  Requested Prescriptions    No prescriptions requested or ordered in this encounter    No follow-ups on file.  Camillia Herter, NP

## 2019-11-17 NOTE — Patient Instructions (Addendum)
Test A1C and vitamin B12 on today.  Foot Pain Many things can cause foot pain. Some common causes are:  An injury.  A sprain.  Arthritis.  Blisters.  Bunions. Follow these instructions at home: Managing pain, stiffness, and swelling If directed, put ice on the painful area:  Put ice in a plastic bag.  Place a towel between your skin and the bag.  Leave the ice on for 20 minutes, 2-3 times a day.  Activity  Do not stand or walk for long periods.  Return to your normal activities as told by your health care provider. Ask your health care provider what activities are safe for you.  Do stretches to relieve foot pain and stiffness as told by your health care provider.  Do not lift anything that is heavier than 10 lb (4.5 kg), or the limit that you are told, until your health care provider says that it is safe. Lifting a lot of weight can put added pressure on your feet. Lifestyle  Wear comfortable, supportive shoes that fit you well. Do not wear high heels.  Keep your feet clean and dry. General instructions  Take over-the-counter and prescription medicines only as told by your health care provider.  Rub your foot gently.  Pay attention to any changes in your symptoms.  Keep all follow-up visits as told by your health care provider. This is important. Contact a health care provider if:  Your pain does not get better after a few days of self-care.  Your pain gets worse.  You cannot stand on your foot. Get help right away if:  Your foot is numb or tingling.  Your foot or toes are swollen.  Your foot or toes turn white or blue.  You have warmth and redness along your foot. Summary  Common causes of foot pain are injury, sprain, arthritis, blisters or bunions.  Ice, medicines, and comfortable shoes may help foot pain.  Contact your health care provider if your pain does not get better after a few days of self-care. This information is not intended to  replace advice given to you by your health care provider. Make sure you discuss any questions you have with your health care provider. Document Revised: 06/16/2018 Document Reviewed: 06/16/2018 Elsevier Patient Education  2020 [employer].

## 2019-11-18 LAB — VITAMIN B12: Vitamin B-12: 286 pg/mL (ref 232–1245)

## 2019-11-18 LAB — HEMOGLOBIN A1C
Est. average glucose Bld gHb Est-mCnc: 114 mg/dL
Hgb A1c MFr Bld: 5.6 % (ref 4.8–5.6)

## 2019-11-18 NOTE — Progress Notes (Signed)
Pleas call to update patient.. Vitamin B12 and A1C normal. No additional treatment needed at this time.

## 2019-11-20 ENCOUNTER — Telehealth: Payer: Self-pay

## 2019-11-20 NOTE — Telephone Encounter (Signed)
Contacted pt and made aware of lab results pt doesn't have any questions or concerns

## 2019-12-05 ENCOUNTER — Other Ambulatory Visit: Payer: Self-pay

## 2019-12-05 ENCOUNTER — Ambulatory Visit: Payer: Self-pay | Attending: Internal Medicine

## 2019-12-08 ENCOUNTER — Telehealth: Payer: Self-pay | Admitting: Internal Medicine

## 2019-12-08 NOTE — Telephone Encounter (Signed)
Patients husband called and requested for Financial coordinator to give him a call. Please follow up at your earliest convenience.

## 2019-12-08 NOTE — Telephone Encounter (Signed)
Return Pt call about her missing the 401K statement

## 2020-05-02 ENCOUNTER — Encounter (HOSPITAL_COMMUNITY): Payer: Self-pay

## 2020-05-02 ENCOUNTER — Ambulatory Visit (HOSPITAL_COMMUNITY)
Admission: EM | Admit: 2020-05-02 | Discharge: 2020-05-02 | Disposition: A | Discharge status: Home / Self Care | Payer: Self-pay | Attending: Urgent Care | Admitting: Urgent Care

## 2020-05-02 ENCOUNTER — Other Ambulatory Visit: Payer: Self-pay

## 2020-05-02 DIAGNOSIS — M25561 Pain in right knee: Secondary | ICD-10-CM

## 2020-05-02 DIAGNOSIS — R0789 Other chest pain: Secondary | ICD-10-CM

## 2020-05-02 MED ORDER — TIZANIDINE HCL 4 MG PO TABS: 4.0000 mg | ORAL_TABLET | Freq: Three times a day (TID) | ORAL | 0 refills | Status: AC | PRN

## 2020-05-02 MED ORDER — NAPROXEN 500 MG PO TABS: 500.0000 mg | ORAL_TABLET | Freq: Two times a day (BID) | ORAL | 0 refills | Status: AC

## 2020-05-02 NOTE — Discharge Instructions (Signed)
Naproxen is for pain and inflammation. Use tizanidine as a muscle relaxant, it can make you sleepy so if it does this then just take it at bedtime. Ice areas that hurt for 20 minutes at a time, stop 2 hours, then start again for the first 24 hours.
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
## 2021-05-06 ENCOUNTER — Other Ambulatory Visit: Payer: Self-pay | Admitting: Internal Medicine

## 2021-05-06 DIAGNOSIS — M79605 Pain in left leg: Secondary | ICD-10-CM

## 2021-05-07 ENCOUNTER — Other Ambulatory Visit: Payer: Self-pay

## 2021-05-07 ENCOUNTER — Ambulatory Visit (INDEPENDENT_AMBULATORY_CARE_PROVIDER_SITE_OTHER): Payer: 59

## 2021-05-07 ENCOUNTER — Encounter (HOSPITAL_COMMUNITY): Payer: Medicaid Other

## 2021-05-07 DIAGNOSIS — M79605 Pain in left leg: Secondary | ICD-10-CM

## 2022-08-04 ENCOUNTER — Other Ambulatory Visit: Payer: Self-pay | Admitting: Internal Medicine

## 2022-08-04 DIAGNOSIS — Z0000 Encounter for general adult medical examination without abnormal findings: Secondary | ICD-10-CM

## 2022-10-01 ENCOUNTER — Ambulatory Visit: Payer: BLUE CROSS/BLUE SHIELD | Admitting: Obstetrics and Gynecology

## 2022-10-01 NOTE — Progress Notes (Signed)
New patient is in the office to establish care.

## 2023-02-12 ENCOUNTER — Telehealth: Payer: BLUE CROSS/BLUE SHIELD | Admitting: Physician Assistant

## 2023-02-12 DIAGNOSIS — J020 Streptococcal pharyngitis: Secondary | ICD-10-CM | POA: Diagnosis not present

## 2023-02-12 MED ORDER — AMOXICILLIN 500 MG PO CAPS: 500.0000 mg | ORAL_CAPSULE | Freq: Two times a day (BID) | ORAL | 0 refills | Status: AC

## 2023-02-12 NOTE — Progress Notes (Signed)

## 2023-08-03 ENCOUNTER — Other Ambulatory Visit: Payer: Self-pay | Admitting: Internal Medicine

## 2023-08-03 DIAGNOSIS — Z0000 Encounter for general adult medical examination without abnormal findings: Secondary | ICD-10-CM
